# Patient Record
Sex: Male | Born: 1950
Health system: Southern US, Community
[De-identification: ages and names within clinical notes are randomized; demographics above are authoritative.]

## PROBLEM LIST (undated history)

## (undated) DIAGNOSIS — K219 Gastro-esophageal reflux disease without esophagitis: Secondary | ICD-10-CM

## (undated) DIAGNOSIS — J449 Chronic obstructive pulmonary disease, unspecified: Secondary | ICD-10-CM

## (undated) DIAGNOSIS — J45909 Unspecified asthma, uncomplicated: Secondary | ICD-10-CM

## (undated) HISTORY — DX: Gastro-esophageal reflux disease without esophagitis: K21.9

## (undated) HISTORY — DX: Unspecified asthma, uncomplicated: J45.909

---

## 2017-03-10 ENCOUNTER — Other Ambulatory Visit: Payer: Self-pay

## 2017-03-10 DIAGNOSIS — Y929 Unspecified place or not applicable: Secondary | ICD-10-CM | POA: Insufficient documentation

## 2017-03-10 DIAGNOSIS — S80862A Insect bite (nonvenomous), left lower leg, initial encounter: Secondary | ICD-10-CM | POA: Insufficient documentation

## 2017-03-10 DIAGNOSIS — L03116 Cellulitis of left lower limb: Secondary | ICD-10-CM | POA: Insufficient documentation

## 2017-03-10 DIAGNOSIS — W57XXXA Bitten or stung by nonvenomous insect and other nonvenomous arthropods, initial encounter: Secondary | ICD-10-CM | POA: Insufficient documentation

## 2017-03-10 DIAGNOSIS — J449 Chronic obstructive pulmonary disease, unspecified: Secondary | ICD-10-CM | POA: Insufficient documentation

## 2017-03-10 DIAGNOSIS — Y9389 Activity, other specified: Secondary | ICD-10-CM | POA: Insufficient documentation

## 2017-03-10 DIAGNOSIS — Y998 Other external cause status: Secondary | ICD-10-CM | POA: Insufficient documentation

## 2017-03-11 ENCOUNTER — Encounter (HOSPITAL_COMMUNITY): Payer: Self-pay | Admitting: Emergency Medicine

## 2017-03-11 ENCOUNTER — Other Ambulatory Visit: Payer: Self-pay

## 2017-03-11 ENCOUNTER — Emergency Department (HOSPITAL_COMMUNITY)
Admission: EM | Admit: 2017-03-11 | Discharge: 2017-03-11 | Disposition: A | Discharge status: Home / Self Care | Payer: Self-pay | Attending: Emergency Medicine | Admitting: Emergency Medicine

## 2017-03-11 DIAGNOSIS — L03116 Cellulitis of left lower limb: Secondary | ICD-10-CM

## 2017-03-11 DIAGNOSIS — W57XXXA Bitten or stung by nonvenomous insect and other nonvenomous arthropods, initial encounter: Secondary | ICD-10-CM

## 2017-03-11 HISTORY — DX: Chronic obstructive pulmonary disease, unspecified: J44.9

## 2017-03-11 MED ORDER — CLINDAMYCIN HCL 150 MG PO CAPS: 300.0000 mg | ORAL_CAPSULE | Freq: Three times a day (TID) | ORAL | 0 refills | Status: DC

## 2017-03-11 MED ORDER — CLINDAMYCIN HCL 150 MG PO CAPS
300.0000 mg | ORAL_CAPSULE | Freq: Once | ORAL | Status: AC
  Administered 2017-03-11: 300 mg via ORAL
  Filled 2017-03-11: qty 2

## 2017-03-11 NOTE — ED Notes (Signed)
ED Provider at bedside. 

## 2017-03-11 NOTE — ED Notes (Signed)
See EDP assessment 

## 2017-03-11 NOTE — ED Notes (Signed)
Pt verbalizes understanding of d/c instructions. Pt received prescriptions. Pt ambulatory at d/c with all belongings.  

## 2017-03-11 NOTE — Discharge Instructions (Signed)
Take the clindamycin as prescribed. Keep area clean with soap and warm water.  Try to keep covered if out and about to prevent further infection/irritation. Follow-up with your primary care doctor. Return here for any new/acute changes-- high fever, increased redness/swelling, etc.

## 2017-03-11 NOTE — ED Triage Notes (Signed)
Pt reports he was bitten by a spider two days ago, he states he "doctored it" but it continues to grow - is red, swollen and painful.

## 2017-03-11 NOTE — ED Provider Notes (Signed)
[facility] EMERGENCY [facility] Provider Note   CSN: [id number] Arrival date & time: 03/10/17  2354     History   Chief Complaint Chief Complaint  Patient presents with  . Insect Bite    HPI Carlos Hudson is a 66 y.o. male.  The history is provided by the patient and medical records.     66 year old male with history of COPD, presenting to the ED with spider bite to left calf.  States this happened 2 days ago.  He has been keeping it clean with alcohol swabs at home but continues to become more red and painful.  He denies any fevers.  No numbness or weakness of the affected leg.  He has no history of diabetes or impaired wound healing.  Past Medical History:  Diagnosis Date  . COPD (chronic obstructive pulmonary disease) (HCC)     There are no active problems to display for this patient.   History reviewed. No pertinent surgical history.     Home Medications    Prior to Admission medications   Medication Sig Start Date End Date Taking? Authorizing Provider  clindamycin (CLEOCIN) 150 MG capsule Take 2 capsules (300 mg total) by mouth 3 (three) times daily. May dispense as 150mg  capsules 03/11/17   Garlon HatchetSanders, Amorah Sebring M, PA-C    Family History No family history on file.  Social History Social History   Tobacco Use  . Smoking status: Not on file  Substance Use Topics  . Alcohol use: Not on file  . Drug use: Not on file     Allergies   Fish allergy   Review of Systems Review of Systems  Skin: Positive for wound.  All other systems reviewed and are negative.    Physical Exam Updated Vital Signs BP 140/66 (BP Location: Right Arm)   Pulse 80   Temp 98.2 F (36.8 C) (Oral)   Resp 18   SpO2 98%   Physical Exam  Constitutional: He is oriented to person, place, and time. He appears well-developed and well-nourished.  HENT:  Head: Normocephalic and atraumatic.  Mouth/Throat: Oropharynx is clear and moist.  Eyes: Conjunctivae and EOM  are normal. Pupils are equal, round, and reactive to light.  Neck: Normal range of motion.  Cardiovascular: Normal rate, regular rhythm and normal heart sounds.  Pulmonary/Chest: Effort normal and breath sounds normal.  Abdominal: Soft. Bowel sounds are normal.  Musculoskeletal: Normal range of motion.  Left medial calf with apparent bug bite, there is surrounding erythema and induration extending about 1.5 cm on all sides; there is no lymphangitis of the leg; area is locally warm to touch, no active drainage or bleeding; no fluctuance  Neurological: He is alert and oriented to person, place, and time.  Skin: Skin is warm and dry.  Psychiatric: He has a normal mood and affect.  Nursing note and vitals reviewed.    ED Treatments / Results  Labs (all labs ordered are listed, but only abnormal results are displayed) Labs Reviewed - No data to display  EKG  EKG Interpretation None       Radiology No results found.  Procedures Procedures (including critical care time)  Medications Ordered in ED Medications  clindamycin (CLEOCIN) capsule 300 mg (not administered)     Initial Impression / Assessment and Plan / ED Course  I have reviewed the triage vital signs and the nursing notes.  Pertinent labs & imaging results that were available during my care of the patient were reviewed by me  and considered in my medical decision making (see chart for details).  66 year old male here with spider bite to left medial calf.  There is evidence of bite on exam with surrounding cellulitic changes.  There is no lymphangitis of the leg.  There is no active drainage, fluctuance, or bleeding.  Patient is not diabetic, no history of delayed wound healing.  VSS, non-toxic in appearance.  Recommended oral antibiotics, first dose given here.  Discussed home wound care and monitoring for any worsening infection.  Close follow-up with PCP.  Discussed plan with patient, he/she acknowledged understanding  and agreed with plan of care.  Return precautions given for new or worsening symptoms.  Final Clinical Impressions(s) / ED Diagnoses   Final diagnoses:  Insect bite, initial encounter  Cellulitis of left lower extremity    ED Discharge Orders        Ordered    clindamycin (CLEOCIN) 150 MG capsule  3 times daily     03/11/17 0131       Garlon HatchetSanders, Krina Mraz M, PA-C 03/11/17 0139    Shon BatonHorton, Courtney F, MD 03/11/17 [phone]

## 2017-04-28 ENCOUNTER — Encounter (HOSPITAL_COMMUNITY): Payer: Self-pay | Admitting: Nurse Practitioner

## 2017-04-28 ENCOUNTER — Other Ambulatory Visit: Payer: Self-pay

## 2017-04-28 ENCOUNTER — Emergency Department (HOSPITAL_COMMUNITY)
Admission: EM | Admit: 2017-04-28 | Discharge: 2017-04-28 | Disposition: A | Discharge status: Home / Self Care | Payer: Self-pay | Attending: Emergency Medicine | Admitting: Emergency Medicine

## 2017-04-28 ENCOUNTER — Emergency Department (HOSPITAL_COMMUNITY): Payer: Self-pay

## 2017-04-28 ENCOUNTER — Encounter: Payer: Self-pay | Admitting: Pediatric Intensive Care

## 2017-04-28 DIAGNOSIS — J449 Chronic obstructive pulmonary disease, unspecified: Secondary | ICD-10-CM | POA: Insufficient documentation

## 2017-04-28 DIAGNOSIS — J111 Influenza due to unidentified influenza virus with other respiratory manifestations: Secondary | ICD-10-CM

## 2017-04-28 DIAGNOSIS — J101 Influenza due to other identified influenza virus with other respiratory manifestations: Secondary | ICD-10-CM | POA: Insufficient documentation

## 2017-04-28 DIAGNOSIS — F17200 Nicotine dependence, unspecified, uncomplicated: Secondary | ICD-10-CM | POA: Insufficient documentation

## 2017-04-28 LAB — INFLUENZA PANEL BY PCR (TYPE A & B)
INFLAPCR: POSITIVE — AB
INFLBPCR: NEGATIVE

## 2017-04-28 MED ORDER — ALBUTEROL SULFATE HFA 108 (90 BASE) MCG/ACT IN AERS: 1.0000 | INHALATION_SPRAY | Freq: Four times a day (QID) | RESPIRATORY_TRACT | 0 refills | Status: DC | PRN

## 2017-04-28 MED ORDER — IPRATROPIUM-ALBUTEROL 0.5-2.5 (3) MG/3ML IN SOLN
3.0000 mL | Freq: Once | RESPIRATORY_TRACT | Status: AC
  Administered 2017-04-28: 3 mL via RESPIRATORY_TRACT
  Filled 2017-04-28: qty 3

## 2017-04-28 MED ORDER — OSELTAMIVIR PHOSPHATE 75 MG PO CAPS: 75.0000 mg | ORAL_CAPSULE | Freq: Two times a day (BID) | ORAL | 0 refills | Status: DC

## 2017-04-28 MED ORDER — OSELTAMIVIR PHOSPHATE 75 MG PO CAPS
75.0000 mg | ORAL_CAPSULE | Freq: Once | ORAL | Status: AC
  Administered 2017-04-28: 75 mg via ORAL
  Filled 2017-04-28: qty 1

## 2017-04-28 NOTE — ED Provider Notes (Signed)
Patient reports cough and flulike symptoms for the past 4 or 5 days.  He states "my breathing is fine" patient is not acutely ill-appearing lungs clear to auscultation.  No respiratory distress Chest x-ray viewed by me   Carlos Hudson, Carlos Nobles, MD 04/28/17 2135

## 2017-04-28 NOTE — Congregational Nurse Program (Signed)
Congregational Nurse Program Note  Date of Encounter: 04/28/2017  Past Medical History: Past Medical History:  Diagnosis Date  . COPD (chronic obstructive pulmonary disease) (HCC)     Encounter Details: CNP Questionnaire - 04/28/17 0840      Questionnaire   Patient Status  Not Applicable    Race  White or Caucasian    Location Patient Served At  [employer]    Insurance  Not Applicable    Uninsured  Uninsured (NEW 1x/quarter)    Food  No food insecurities    Housing/Utilities  Worried about losing housing    Transportation  Yes, need transportation assistance    Interpersonal Safety  Yes, feel physically and emotionally safe where you currently live    Medication  No medication insecurities    Medical Provider  No    Referrals  Emergency Department    ED Visit Averted  Not Applicable    Life-Saving Intervention Made  Not Applicable      Lobby guest. Complains of nausea and vomiting, lack of appetite since Friday. States he has body aches, sweating. States he has been drinking fluids. States that he has burning on urination and his urine is "dark".  BBS- clear with good air movement. Client temp is 99.8 and he appears ill. Denies any chronic health issues, alcohol or drug use. CN advises client be evaluated in ED as he does not have a PCP or insurance. Client agrees. Cab vouchers given.

## 2017-04-28 NOTE — ED Provider Notes (Signed)
[facility]-EMERGENCY DEPT Provider Note   CSN: [id number] Arrival date & time: 04/28/17  [id number]     History   Chief Complaint Chief Complaint  Patient presents with  . flu like symptoms    HPI   Blood pressure 118/90, pulse 78, temperature 98.3 F (36.8 C), temperature source Oral, resp. rate 16, height 6' (1.829 m), weight 84.8 kg (187 lb), SpO2 94 %.  Carlos Hudson is a 67 y.o. male with past medical history significant for tobacco use, COPD sent from home a shelter for evaluation of low oxygen and flu.  Patient states that approximately 4 days ago he started feeling very lightheaded with tactile fever, chills, rhinorrhea, cough.  No chest pain, he has had some mild shortness of breath with no dyspnea on exertion, orthopnea, PND, increasing peripheral edema.  Past Medical History:  Diagnosis Date  . COPD (chronic obstructive pulmonary disease) (HCC)     There are no active problems to display for this patient.   History reviewed. No pertinent surgical history.     Home Medications    Prior to Admission medications   Medication Sig Start Date End Date Taking? Authorizing Provider  albuterol (PROVENTIL HFA;VENTOLIN HFA) 108 (90 Base) MCG/ACT inhaler Inhale 1-2 puffs into the lungs every 6 (six) hours as needed for wheezing or shortness of breath. 04/28/17   Dylin Ihnen, Joni ReiningNicole, PA-C  clindamycin (CLEOCIN) 150 MG capsule Take 2 capsules (300 mg total) by mouth 3 (three) times daily. May dispense as 150mg  capsules Patient not taking: Reported on 04/28/2017 03/11/17   Garlon HatchetSanders, Lisa M, PA-C  oseltamivir (TAMIFLU) 75 MG capsule Take 1 capsule (75 mg total) by mouth every 12 (twelve) hours. 04/28/17   Cloee Dunwoody, Mardella LaymanNicole, PA-C    Family History History reviewed. No pertinent family history.  Social History Social History   Tobacco Use  . Smoking status: Current Every Day Smoker  Substance Use Topics  . Alcohol use: No    Frequency: Never  . Drug use: No      Allergies   Clindamycin/lincomycin; Iodinated diagnostic agents; and Fish allergy   Review of Systems Review of Systems  A complete review of systems was obtained and all systems are negative except as noted in the HPI and PMH.   Physical Exam Updated Vital Signs BP 118/90 (BP Location: Left Arm)   Pulse 78   Temp 98.3 F (36.8 C) (Oral)   Resp 16   Ht 6' (1.829 m)   Wt 84.8 kg (187 lb)   SpO2 94%   BMI 25.36 kg/m   Physical Exam  Constitutional: He is oriented to person, place, and time. He appears well-developed and well-nourished. No distress.  HENT:  Head: Normocephalic and atraumatic.  Mouth/Throat: Oropharynx is clear and moist.  Eyes: Conjunctivae and EOM are normal. Pupils are equal, round, and reactive to light.  Neck: Normal range of motion.  Cardiovascular: Normal rate, regular rhythm and intact distal pulses.  Pulmonary/Chest: Effort normal.  Reduced air movement at the bases, expiratory wheezing, mild and scattered.  Abdominal: Soft. There is no tenderness.  Musculoskeletal: Normal range of motion.  Neurological: He is alert and oriented to person, place, and time.  Skin: He is not diaphoretic.  Psychiatric: He has a normal mood and affect.  Nursing note and vitals reviewed.    ED Treatments / Results  Labs (all labs ordered are listed, but only abnormal results are displayed) Labs Reviewed  INFLUENZA PANEL BY PCR (TYPE A & B) - Abnormal;  Notable for the following components:      Result Value   Influenza A By PCR POSITIVE (*)    All other components within normal limits    EKG  EKG Interpretation None       Radiology Dg Chest 2 View  Result Date: 04/28/2017 CLINICAL DATA:  Cough and congestion for 4 or 5 days. EXAM: CHEST  2 VIEW COMPARISON:  None. FINDINGS: The heart, hila, and mediastinum are normal. No pulmonary nodules or masses. No focal infiltrates. Mild prominence of the interstitial markings. IMPRESSION: No focal infiltrate.  Mild prominence of the interstitial markings may be due to the patient's smoking status or an atypical infection. Electronically Signed   By: Gerome Sam III M.D   On: 04/28/2017 19:50    Procedures Procedures (including critical care time)  Medications Ordered in ED Medications  oseltamivir (TAMIFLU) capsule 75 mg (not administered)  ipratropium-albuterol (DUONEB) 0.5-2.5 (3) MG/3ML nebulizer solution 3 mL (3 mLs Nebulization Given 04/28/17 2011)     Initial Impression / Assessment and Plan / ED Course  I have reviewed the triage vital signs and the nursing notes.  Pertinent labs & imaging results that were available during my care of the patient were reviewed by me and considered in my medical decision making (see chart for details).    Vitals:   04/28/17 1515 04/28/17 1731 04/28/17 2010 04/28/17 2011  BP: 113/71 124/78 118/90   Pulse: 73 87 78   Resp: 18 14 16    Temp:  98.3 F (36.8 C)    TempSrc:  Oral    SpO2: 93% 95% 100% 94%  Weight:      Height:        Medications  oseltamivir (TAMIFLU) capsule 75 mg (not administered)  ipratropium-albuterol (DUONEB) 0.5-2.5 (3) MG/3ML nebulizer solution 3 mL (3 mLs Nebulization Given 04/28/17 2011)    Carlos Hudson is 67 y.o. male presenting with hypoxia, influenza-like illness and cough over the last 4 days.  Initially patient was satting around 90% on room air.  Lung sounds with mild wheezing.  No signs or symptoms to suggest CHF exacerbation.  More likely COPD.  Patient given DuoNeb, chest x-ray without infiltrate, flu pending.  Flu a positive, patient comfortable, remains afebrile, saturating well on room air.  Given his underlying COPD and smoking will start him on Tamiflu.  He states that there is a program that his housing development that will help him pay for his medicine.  Evaluation does not show pathology that would require ongoing emergent intervention or inpatient treatment. Pt is hemodynamically stable and mentating  appropriately. Discussed findings and plan with patient/guardian, who agrees with care plan. All questions answered. Return precautions discussed and outpatient follow up given.     Final Clinical Impressions(s) / ED Diagnoses   Final diagnoses:  Influenza    ED Discharge Orders        Ordered    oseltamivir (TAMIFLU) 75 MG capsule  Every 12 hours     04/28/17 2138    albuterol (PROVENTIL HFA;VENTOLIN HFA) 108 (90 Base) MCG/ACT inhaler  Every 6 hours PRN     04/28/17 2139       Mateusz Neilan, Mardella Layman 04/28/17 2140    Doug Sou, MD 04/28/17 [phone]

## 2017-04-28 NOTE — ED Triage Notes (Signed)
Patient was at [facility] and the nurse took his blood pressure and stated it was low to come to the ER via taxi.

## 2017-04-28 NOTE — Discharge Instructions (Signed)
Please follow with your primary care doctor in the next 2 days for a check-up. They must obtain records for further management.  ° °Do not hesitate to return to the Emergency Department for any new, worsening or concerning symptoms.  ° °

## 2017-04-28 NOTE — ED Notes (Signed)
Talked with [facility] they will accept him back to the lobby for the night

## 2017-04-28 NOTE — ED Triage Notes (Signed)
Patient here from [facility] due to loosing job and being out of a home for a few days. Patient states he doesn't not drink ETOH or do any illegal drugs just needed help with housing. Patient oxygen was low not his blood pressure. Patient states he has been having fever, chills body aches for 4 days now. Patient has been nauseous and vomits if he trys to eat. Patient never received a flu shot this year. Nurse wanted patient checked for the flu.

## 2017-05-01 ENCOUNTER — Encounter: Payer: Self-pay | Admitting: Pediatric Intensive Care

## 2017-05-01 MED FILL — VENTOLIN HFA 90 MCG INHALER: 108 (90 BAS | 25 days supply | Qty: 18 | Fill #0

## 2017-05-01 MED FILL — OSELTAMIVIR PHOSPHATE 75 MG: 75 | 5 days supply | Qty: 10 | Fill #0

## 2017-05-12 ENCOUNTER — Encounter: Payer: Self-pay | Admitting: Pediatric Intensive Care

## 2017-05-18 ENCOUNTER — Encounter: Payer: Self-pay | Admitting: Pediatric Intensive Care

## 2017-05-21 NOTE — Congregational Nurse Program (Signed)
Congregational Nurse Program Note  Date of Encounter: 05/01/2017  Past Medical History: Past Medical History:  Diagnosis Date  . COPD (chronic obstructive pulmonary disease) (HCC)     Encounter Details: CNP Questionnaire - 05/01/17 1000      Questionnaire   Patient Status  Not Applicable    Race  White or Caucasian    Location Patient Served At  [employer]    Insurance  Not Applicable    Uninsured  Uninsured (Subsequent visits/quarter)    Food  No food insecurities    Housing/Utilities  No permanent housing    Transportation  No transportation needs    Interpersonal Safety  Yes, feel physically and emotionally safe where you currently live    Medication  Yes, have medication insecurities;Provided medication assistance    Medical Provider  No    Referrals  Not Applicable    ED Visit Averted  Not Applicable    Life-Saving Intervention Made  Not Applicable      Client was diagnosed with flu in ED and has prescriptions for Tamiflu and albuterol inhaler. CN will pick up prescriptions. BBS- course with inspiratory wheezing. CN advised client to follow up in clinic next week.

## 2017-05-30 NOTE — Congregational Nurse Program (Signed)
Congregational Nurse Program Note  Date of Encounter: 05/18/2017  Past Medical History: Past Medical History:  Diagnosis Date  . COPD (chronic obstructive pulmonary disease) (HCC)     Encounter Details: CNP Questionnaire - 05/18/17 1045      Questionnaire   Patient Status  Not Applicable    Race  White or Caucasian    Location [facility] Served At  [employer]    Insurance  Not Applicable    Uninsured  Uninsured (Subsequent visits/quarter)    Food  No food insecurities    Housing/Utilities  No permanent housing    Transportation  Yes, need transportation assistance;Provided transportation assistance (bus pass, taxi voucher, etc.)    Interpersonal Safety  Yes, feel physically and emotionally safe where you currently live    Medication  No medication insecurities    Medical Provider  No    Referrals  Primary Care Provider/Clinic    ED Visit Averted  Not Applicable    Life-Saving Intervention Made  Not Applicable      [facility].

## 2017-05-30 NOTE — Congregational Nurse Program (Signed)
Congregational Nurse Program Note  Date of Encounter: 05/12/2017  Past Medical History: Past Medical History:  Diagnosis Date  . COPD (chronic obstructive pulmonary disease) (HCC)     Encounter Details: CNP Questionnaire - 05/12/17 0930      Questionnaire   Patient Status  Not Applicable    Race  White or Caucasian    Location Patient Served At  [employer]    Insurance  Not Applicable    Uninsured  Uninsured (Subsequent visits/quarter)    Food  No food insecurities    Housing/Utilities  No permanent housing    Transportation  No transportation needs    Interpersonal Safety  Yes, feel physically and emotionally safe where you currently live    Medication  No medication insecurities    Medical Provider  No    Referrals  Primary Care Provider/Clinic    ED Visit Averted  Not Applicable    Life-Saving Intervention Made  Not Applicable      Client states low energy and nausea since having flu a few weeks ago. Marland Kitchen. He states he has not had diarrhea or vomiting. He spoke with the [employer] intern regarding signing up for Medicare. Follow up in CN clinic if symptoms do not resolve.

## 2017-06-10 ENCOUNTER — Inpatient Hospital Stay: Payer: Self-pay | Admitting: Critical Care Medicine

## 2017-06-21 ENCOUNTER — Emergency Department (HOSPITAL_COMMUNITY): Payer: Self-pay

## 2017-06-21 ENCOUNTER — Encounter (HOSPITAL_COMMUNITY): Payer: Self-pay

## 2017-06-21 ENCOUNTER — Emergency Department (HOSPITAL_COMMUNITY)
Admission: EM | Admit: 2017-06-21 | Discharge: 2017-06-21 | Disposition: A | Discharge status: Home / Self Care | Payer: Self-pay | Attending: Emergency Medicine | Admitting: Emergency Medicine

## 2017-06-21 DIAGNOSIS — J440 Chronic obstructive pulmonary disease with acute lower respiratory infection: Secondary | ICD-10-CM | POA: Insufficient documentation

## 2017-06-21 DIAGNOSIS — J209 Acute bronchitis, unspecified: Secondary | ICD-10-CM | POA: Insufficient documentation

## 2017-06-21 DIAGNOSIS — F17200 Nicotine dependence, unspecified, uncomplicated: Secondary | ICD-10-CM | POA: Insufficient documentation

## 2017-06-21 DIAGNOSIS — Z7982 Long term (current) use of aspirin: Secondary | ICD-10-CM | POA: Insufficient documentation

## 2017-06-21 LAB — CBC WITH DIFFERENTIAL/PLATELET
Basophils Absolute: 0 10*3/uL (ref 0.0–0.1)
Basophils Relative: 0 %
EOS ABS: 0.1 10*3/uL (ref 0.0–0.7)
Eosinophils Relative: 1 %
HEMATOCRIT: 47 % (ref 39.0–52.0)
HEMOGLOBIN: 15.3 g/dL (ref 13.0–17.0)
LYMPHS ABS: 1.9 10*3/uL (ref 0.7–4.0)
Lymphocytes Relative: 17 %
MCH: 29.4 pg (ref 26.0–34.0)
MCHC: 32.6 g/dL (ref 30.0–36.0)
MCV: 90.4 fL (ref 78.0–100.0)
Monocytes Absolute: 1 10*3/uL (ref 0.1–1.0)
Monocytes Relative: 9 %
NEUTROS ABS: 7.9 10*3/uL — AB (ref 1.7–7.7)
NEUTROS PCT: 73 %
Platelets: 285 10*3/uL (ref 150–400)
RBC: 5.2 MIL/uL (ref 4.22–5.81)
RDW: 14.1 % (ref 11.5–15.5)
WBC: 10.8 10*3/uL — AB (ref 4.0–10.5)

## 2017-06-21 LAB — COMPREHENSIVE METABOLIC PANEL
ALT: 16 U/L — AB (ref 17–63)
ANION GAP: 8 (ref 5–15)
AST: 18 U/L (ref 15–41)
Albumin: 4 g/dL (ref 3.5–5.0)
Alkaline Phosphatase: 61 U/L (ref 38–126)
BUN: 11 mg/dL (ref 6–20)
CHLORIDE: 103 mmol/L (ref 101–111)
CO2: 22 mmol/L (ref 22–32)
CREATININE: 1.02 mg/dL (ref 0.61–1.24)
Calcium: 8.8 mg/dL — ABNORMAL LOW (ref 8.9–10.3)
Glucose, Bld: 106 mg/dL — ABNORMAL HIGH (ref 65–99)
POTASSIUM: 4.2 mmol/L (ref 3.5–5.1)
Sodium: 133 mmol/L — ABNORMAL LOW (ref 135–145)
Total Bilirubin: 0.9 mg/dL (ref 0.3–1.2)
Total Protein: 7.5 g/dL (ref 6.5–8.1)

## 2017-06-21 MED ORDER — ALBUTEROL SULFATE HFA 108 (90 BASE) MCG/ACT IN AERS
1.0000 | INHALATION_SPRAY | Freq: Four times a day (QID) | RESPIRATORY_TRACT | Status: DC
  Administered 2017-06-21: 1 via RESPIRATORY_TRACT
  Filled 2017-06-21: qty 6.7

## 2017-06-21 MED ORDER — ALBUTEROL SULFATE (2.5 MG/3ML) 0.083% IN NEBU
5.0000 mg | INHALATION_SOLUTION | Freq: Once | RESPIRATORY_TRACT | Status: AC
  Administered 2017-06-21: 5 mg via RESPIRATORY_TRACT
  Filled 2017-06-21: qty 6

## 2017-06-21 MED ORDER — AEROCHAMBER PLUS FLO-VU MEDIUM MISC: 1.0000 | Freq: Once | Status: DC

## 2017-06-21 NOTE — Discharge Instructions (Signed)
Use your albuterol inhaler with spacer 2 puffs every 4 hours as needed for cough or shortness of breath.  Return if needed more than every 4 hours.  [facility] at [facility] to help you to stop smoking

## 2017-06-21 NOTE — ED Triage Notes (Signed)
Patient complains of 1 week of cough and congestion. States that the cough is making him feel SOB. Patient alert and oriented, smoker

## 2017-06-21 NOTE — ED Notes (Signed)
PT ambulated while on pulse oximetry Pt O2 sats remained in the 93-95 range and HR in the 80s.

## 2017-06-21 NOTE — ED Provider Notes (Signed)
[facility] EMERGENCY DEPARTMENT Provider Note   CSN: [id number] Arrival date & time: 06/21/17  0850     History   Chief Complaint Chief Complaint  Patient presents with  . cough/SOB    HPI Carlos Hudson is a 67 y.o. male.  HPI Complains of cough productive of white sputum for 1 week.  He denies any fever denies vomiting nothing makes symptoms better or worse.  Associated symptoms include bilateral chest pain with coughing only.  No vomiting.  No other associated symptoms.  No treatment prior to coming here.  He reports that he ran out of his albuterol inhaler 3 days ago. Past Medical History:  Diagnosis Date  . COPD (chronic obstructive pulmonary disease) (HCC)     There are no active problems to display for this patient.   History reviewed. No pertinent surgical history.      Home Medications    Prior to Admission medications   Medication Sig Start Date End Date Taking? Authorizing Provider  albuterol (PROVENTIL HFA;VENTOLIN HFA) 108 (90 Base) MCG/ACT inhaler Inhale 1-2 puffs into the lungs every 6 (six) hours as needed for wheezing or shortness of breath. 04/28/17  Yes Pisciotta, Mardella Layman  aspirin 325 MG EC tablet Take 325 mg by mouth daily.   Yes [provider]  clindamycin (CLEOCIN) 150 MG capsule Take 2 capsules (300 mg total) by mouth 3 (three) times daily. May dispense as 150mg  capsules Patient not taking: Reported on 04/28/2017 03/11/17   Garlon Hatchet, PA-C  oseltamivir (TAMIFLU) 75 MG capsule Take 1 capsule (75 mg total) by mouth every 12 (twelve) hours. Patient not taking: Reported on 06/21/2017 04/28/17   Pisciotta, Joni Reining, PA-C    Family History No family history on file.  Social History Social History   Tobacco Use  . Smoking status: Current Every Day Smoker  . Smokeless tobacco: Never Used  Substance Use Topics  . Alcohol use: No    Frequency: Never  . Drug use: No     Allergies   Clindamycin/lincomycin;  Iodinated diagnostic agents; and Fish allergy   Review of Systems Review of Systems  Constitutional: Negative.   HENT: Negative.   Respiratory: Positive for cough and shortness of breath.   Cardiovascular: Negative.   Gastrointestinal: Negative.   Musculoskeletal: Negative.   Skin: Negative.   Neurological: Negative.   Psychiatric/Behavioral: Negative.   All other systems reviewed and are negative.    Physical Exam Updated Vital Signs BP 120/67 (BP Location: Right Arm)   Pulse 86   Temp 98.1 F (36.7 C) (Oral)   Resp 20   SpO2 99%   Physical Exam  Constitutional: He appears well-developed and well-nourished. No distress.  HENT:  Head: Normocephalic and atraumatic.  Eyes: Pupils are equal, round, and reactive to light. Conjunctivae are normal.  Neck: Neck supple. No tracheal deviation present. No thyromegaly present.  Cardiovascular: Normal rate and regular rhythm.  No murmur heard. Pulmonary/Chest: Effort normal. He has wheezes.  No respiratory distress, speaks in paragraphs.  End expiratory wheezes  Abdominal: Soft. Bowel sounds are normal. He exhibits no distension. There is no tenderness.  Musculoskeletal: Normal range of motion. He exhibits no edema or tenderness.  Neurological: He is alert. Coordination normal.  Skin: Skin is warm and dry. No rash noted.  Psychiatric: He has a normal mood and affect.  Nursing note and vitals reviewed.    ED Treatments / Results  Labs (all labs ordered are listed, but only abnormal results are  displayed) Labs Reviewed  COMPREHENSIVE METABOLIC PANEL - Abnormal; Notable for the following components:      Result Value   Sodium 133 (*)    Glucose, Bld 106 (*)    Calcium 8.8 (*)    ALT 16 (*)    All other components within normal limits  CBC WITH DIFFERENTIAL/PLATELET - Abnormal; Notable for the following components:   WBC 10.8 (*)    Neutro Abs 7.9 (*)    All other components within normal limits   Chest x-ray viewed by  me EKG EKG Interpretation  Date/Time:  Sunday June 21 2017 08:53:51 EDT Ventricular Rate:  84 PR Interval:  150 QRS Duration: 134 QT Interval:  390 QTC Calculation: 460 R Axis:   106 Text Interpretation:  Normal sinus rhythm Right bundle branch block Abnormal ECG No old tracing to compare Confirmed by Doug SouJacubowitz, Raziyah Vanvleck [phone]([id number]) on 06/21/2017 12:45:07 PM  Results for orders placed or performed during the hospital encounter of 06/21/17  Comprehensive metabolic panel  Result Value Ref Range   Sodium 133 (L) 135 - 145 mmol/L   Potassium 4.2 3.5 - 5.1 mmol/L   Chloride 103 101 - 111 mmol/L   CO2 22 22 - 32 mmol/L   Glucose, Bld 106 (H) 65 - 99 mg/dL   BUN 11 6 - 20 mg/dL   Creatinine, Ser 6.041.02 0.61 - 1.24 mg/dL   Calcium 8.8 (L) 8.9 - 10.3 mg/dL   Total Protein 7.5 6.5 - 8.1 g/dL   Albumin 4.0 3.5 - 5.0 g/dL   AST 18 15 - 41 U/L   ALT 16 (L) 17 - 63 U/L   Alkaline Phosphatase 61 38 - 126 U/L   Total Bilirubin 0.9 0.3 - 1.2 mg/dL   GFR calc non Af Amer >60 >60 mL/min   GFR calc Af Amer >60 >60 mL/min   Anion gap 8 5 - 15  CBC with Differential  Result Value Ref Range   WBC 10.8 (H) 4.0 - 10.5 K/uL   RBC 5.20 4.22 - 5.81 MIL/uL   Hemoglobin 15.3 13.0 - 17.0 g/dL   HCT 54.047.0 98.139.0 - 19.152.0 %   MCV 90.4 78.0 - 100.0 fL   MCH 29.4 26.0 - 34.0 pg   MCHC 32.6 30.0 - 36.0 g/dL   RDW 47.814.1 29.511.5 - 62.115.5 %   Platelets 285 150 - 400 K/uL   Neutrophils Relative % 73 %   Neutro Abs 7.9 (H) 1.7 - 7.7 K/uL   Lymphocytes Relative 17 %   Lymphs Abs 1.9 0.7 - 4.0 K/uL   Monocytes Relative 9 %   Monocytes Absolute 1.0 0.1 - 1.0 K/uL   Eosinophils Relative 1 %   Eosinophils Absolute 0.1 0.0 - 0.7 K/uL   Basophils Relative 0 %   Basophils Absolute 0.0 0.0 - 0.1 K/uL   Dg Chest 2 View  Result Date: 06/21/2017 CLINICAL DATA:  Cough for several weeks.  Former smoker. EXAM: CHEST - 2 VIEW COMPARISON:  04/28/2017 FINDINGS: Normal heart size. Aortic atherosclerosis noted. No pleural effusion or edema.  Chronic interstitial coarsening is noted throughout both lungs with diffuse bronchial wall thickening. No superimposed airspace consolidation. IMPRESSION: 1. No acute cardiopulmonary abnormality. 2. Chronic interstitial coarsening. Electronically Signed   By: Signa Kellaylor  Stroud M.D.   On: 06/21/2017 09:45   Radiology Dg Chest 2 View  Result Date: 06/21/2017 CLINICAL DATA:  Cough for several weeks.  Former smoker. EXAM: CHEST - 2 VIEW COMPARISON:  04/28/2017 FINDINGS: Normal heart size. Aortic  atherosclerosis noted. No pleural effusion or edema. Chronic interstitial coarsening is noted throughout both lungs with diffuse bronchial wall thickening. No superimposed airspace consolidation. IMPRESSION: 1. No acute cardiopulmonary abnormality. 2. Chronic interstitial coarsening. Electronically Signed   By: Signa Kell M.D.   On: 06/21/2017 09:45    Procedures Procedures (including critical care time)  Medications Ordered in ED Medications  albuterol (PROVENTIL) (2.5 MG/3ML) 0.083% nebulizer solution 5 mg (has no administration in time range)     Initial Impression / Assessment and Plan / ED Course  I have reviewed the triage vital signs and the nursing notes.  Pertinent labs & imaging results that were available during my care of the patient were reviewed by me and considered in my medical decision making (see chart for details).     2:10 PM after 1 nebulized treatment with albuterol she is breathing at baseline.  He is able to ambulate in the emergency department without dyspnea.  Pulse oximetry 94% on room air.  His breathing is at baseline. Plan he will get an albuterol inhaler with spacer to go to use 2 puffs every 4 hours as needed for cough or shortness of breath instructed to return if needed more than hours.  I counseled patient for 5 minutes on smoking cessation  Final Clinical Impressions(s) / ED Diagnoses  Diagnosis #1 COPD exacerbation #2 acute bronchitis #3 tobacco abuse Final  diagnoses:  None    ED Discharge Orders    None       Doug Sou, MD 06/21/17 1422

## 2017-06-22 ENCOUNTER — Encounter: Payer: Self-pay | Admitting: Pediatric Intensive Care

## 2017-06-22 ENCOUNTER — Other Ambulatory Visit: Payer: Self-pay | Admitting: Critical Care Medicine

## 2017-06-22 MED ORDER — PREDNISONE 10 MG PO TABS: ORAL_TABLET | ORAL | 0 refills | Status: DC

## 2017-06-22 MED ORDER — FAMOTIDINE 20 MG PO TABS: 20.0000 mg | ORAL_TABLET | Freq: Every day | ORAL | 6 refills | Status: DC

## 2017-06-22 MED ORDER — AZITHROMYCIN 250 MG PO TABS: ORAL_TABLET | ORAL | 0 refills | Status: DC

## 2017-06-22 MED FILL — AZITHROMYCIN 250 MG TABLET: 250 | 5 days supply | Qty: 6 | Fill #0

## 2017-06-22 MED FILL — predniSONE 10 MG TABS: 10 | 8 days supply | Qty: 20 | Fill #0

## 2017-06-22 MED FILL — FAMOTIDINE 20 MG TABLET: 20 | 30 days supply | Qty: 30 | Fill #0

## 2017-06-22 NOTE — Progress Notes (Signed)
[facility] Pt with COPD and recent exacerbation.  Pt has albuterol inhaler Pt taking ASA daily  Pt with epigastric pain, cough and dyspnea. Hx of gastric ulcer  Down to 2 cig per day Pt recently seen in ED Rx albuterol neb and HFA prn alone  Px: Chest: insp/exp wheezes Cor RRR nl s1 s2 Abd Epigastric tenderness  Impr Copd exac Plan Cont saba two puff qid and prn   Instructed to proper use Azithromycin 250mg  Take two once then one daily until gone #6 Pred pulse 10mg  :  Take 4 for two days three for two days two for two days one for two days pepcid 20mg  qhs Stop ASA See me 07/15/17    Luisa HartPatrick WrightMD

## 2017-06-22 NOTE — Congregational Nurse Program (Signed)
Congregational Nurse Program Note  Date of Encounter: 06/22/2017  Past Medical History: Past Medical History:  Diagnosis Date  . COPD (chronic obstructive pulmonary disease) [facility](HCC)     Encounter Details: CNP Questionnaire - 06/22/17 0915      Questionnaire   Patient Status  Not Applicable    Race  White or Caucasian    [facility]    Insurance  Not Applicable    Uninsured  Uninsured (NEW 1x/quarter)    Food  No food insecurities    Housing/Utilities  No permanent housing    Transportation  Yes, need transportation assistance    Interpersonal Safety  Yes, feel physically and emotionally safe where you currently live    Medication  Yes, have medication insecurities;Provided medication assistance    Medical Provider  No    Referrals  Primary Care Provider/Clinic;Other    ED Visit Averted  Not Applicable    Life-Saving Intervention Made  Not Applicable      Client states that he went to ED due to breathing problems and increased coughing. Client assessed by Dr Jonni SangerP Wright in clinic. Noted significant wheezing. Dr Delford FieldWright will prescribe medication to Outpatient Pharmacy and CN will pick up. Client states that his partner cancelled his pulmonology appointment for this week but that [facility] CN advised client to keep in communication regarding appointments. Dr Delford FieldWright will follow up with client on 4/24.

## 2017-06-23 ENCOUNTER — Encounter: Payer: Self-pay | Admitting: Pediatric Intensive Care

## 2017-06-24 ENCOUNTER — Ambulatory Visit: Payer: Self-pay | Admitting: Critical Care Medicine

## 2017-07-03 ENCOUNTER — Emergency Department (HOSPITAL_COMMUNITY)
Admission: EM | Admit: 2017-07-03 | Discharge: 2017-07-03 | Disposition: A | Discharge status: Home / Self Care | Payer: Self-pay | Attending: Emergency Medicine | Admitting: Emergency Medicine

## 2017-07-03 ENCOUNTER — Encounter (HOSPITAL_COMMUNITY): Payer: Self-pay

## 2017-07-03 ENCOUNTER — Inpatient Hospital Stay: Payer: Self-pay

## 2017-07-03 ENCOUNTER — Emergency Department (HOSPITAL_COMMUNITY): Payer: Self-pay

## 2017-07-03 ENCOUNTER — Other Ambulatory Visit: Payer: Self-pay

## 2017-07-03 DIAGNOSIS — R0602 Shortness of breath: Secondary | ICD-10-CM | POA: Insufficient documentation

## 2017-07-03 DIAGNOSIS — R05 Cough: Secondary | ICD-10-CM | POA: Insufficient documentation

## 2017-07-03 DIAGNOSIS — J181 Lobar pneumonia, unspecified organism: Secondary | ICD-10-CM | POA: Insufficient documentation

## 2017-07-03 DIAGNOSIS — J449 Chronic obstructive pulmonary disease, unspecified: Secondary | ICD-10-CM | POA: Insufficient documentation

## 2017-07-03 DIAGNOSIS — J189 Pneumonia, unspecified organism: Secondary | ICD-10-CM

## 2017-07-03 DIAGNOSIS — Z79899 Other long term (current) drug therapy: Secondary | ICD-10-CM | POA: Insufficient documentation

## 2017-07-03 DIAGNOSIS — F17210 Nicotine dependence, cigarettes, uncomplicated: Secondary | ICD-10-CM | POA: Insufficient documentation

## 2017-07-03 LAB — CBC
HEMATOCRIT: 43.8 % (ref 39.0–52.0)
HEMOGLOBIN: 14.8 g/dL (ref 13.0–17.0)
MCH: 30.3 pg (ref 26.0–34.0)
MCHC: 33.8 g/dL (ref 30.0–36.0)
MCV: 89.8 fL (ref 78.0–100.0)
Platelets: 317 10*3/uL (ref 150–400)
RBC: 4.88 MIL/uL (ref 4.22–5.81)
RDW: 13.9 % (ref 11.5–15.5)
WBC: 22.9 10*3/uL — AB (ref 4.0–10.5)

## 2017-07-03 LAB — COMPREHENSIVE METABOLIC PANEL
ALBUMIN: 3.6 g/dL (ref 3.5–5.0)
ALT: 14 U/L — ABNORMAL LOW (ref 17–63)
ANION GAP: 10 (ref 5–15)
AST: 16 U/L (ref 15–41)
Alkaline Phosphatase: 76 U/L (ref 38–126)
BUN: 14 mg/dL (ref 6–20)
CHLORIDE: 103 mmol/L (ref 101–111)
CO2: 21 mmol/L — ABNORMAL LOW (ref 22–32)
Calcium: 8.7 mg/dL — ABNORMAL LOW (ref 8.9–10.3)
Creatinine, Ser: 1.02 mg/dL (ref 0.61–1.24)
GFR calc Af Amer: >60 mL/min (ref >60)
GFR calc non Af Amer: >60 mL/min (ref >60)
GLUCOSE: 135 mg/dL — AB (ref 65–99)
POTASSIUM: 4.4 mmol/L (ref 3.5–5.1)
Sodium: 134 mmol/L — ABNORMAL LOW (ref 135–145)
Total Bilirubin: 0.8 mg/dL (ref 0.3–1.2)
Total Protein: 7.5 g/dL (ref 6.5–8.1)

## 2017-07-03 LAB — URINALYSIS, ROUTINE W REFLEX MICROSCOPIC
Bilirubin Urine: NEGATIVE
Glucose, UA: NEGATIVE mg/dL
Hgb urine dipstick: NEGATIVE
KETONES UR: NEGATIVE mg/dL
LEUKOCYTES UA: NEGATIVE
Nitrite: NEGATIVE
PROTEIN: NEGATIVE mg/dL
Specific Gravity, Urine: 1.006 (ref 1.005–1.030)
pH: 7 (ref 5.0–8.0)

## 2017-07-03 LAB — LIPASE, BLOOD: LIPASE: 32 U/L (ref 11–51)

## 2017-07-03 LAB — I-STAT TROPONIN, ED: Troponin i, poc: 0.01 ng/mL (ref 0.00–0.08)

## 2017-07-03 MED ORDER — SODIUM CHLORIDE 0.9 % IV BOLUS
1000.0000 mL | Freq: Once | INTRAVENOUS | Status: AC
  Administered 2017-07-03: 1000 mL via INTRAVENOUS

## 2017-07-03 MED ORDER — SODIUM CHLORIDE 0.9 % IV SOLN
500.0000 mg | Freq: Once | INTRAVENOUS | Status: AC
  Administered 2017-07-03: 500 mg via INTRAVENOUS
  Filled 2017-07-03: qty 500

## 2017-07-03 MED ORDER — AZITHROMYCIN 250 MG PO TABS: 250.0000 mg | ORAL_TABLET | Freq: Every day | ORAL | 0 refills | Status: DC

## 2017-07-03 MED ORDER — ALBUTEROL SULFATE (2.5 MG/3ML) 0.083% IN NEBU
5.0000 mg | INHALATION_SOLUTION | Freq: Once | RESPIRATORY_TRACT | Status: AC
  Administered 2017-07-03: 5 mg via RESPIRATORY_TRACT
  Filled 2017-07-03: qty 6

## 2017-07-03 MED ORDER — SODIUM CHLORIDE 0.9 % IV BOLUS
500.0000 mL | Freq: Once | INTRAVENOUS | Status: AC
  Administered 2017-07-03: 500 mL via INTRAVENOUS

## 2017-07-03 MED ORDER — AMOXICILLIN 500 MG PO CAPS: 500.0000 mg | ORAL_CAPSULE | Freq: Three times a day (TID) | ORAL | 0 refills | Status: DC

## 2017-07-03 MED ORDER — SODIUM CHLORIDE 0.9 % IV SOLN
1.0000 g | Freq: Once | INTRAVENOUS | Status: AC
  Administered 2017-07-03: 1 g via INTRAVENOUS
  Filled 2017-07-03: qty 10

## 2017-07-03 MED ORDER — ALBUTEROL SULFATE HFA 108 (90 BASE) MCG/ACT IN AERS: 2.0000 | INHALATION_SPRAY | RESPIRATORY_TRACT | 1 refills | Status: DC | PRN

## 2017-07-03 NOTE — ED Triage Notes (Signed)
Pt endorses abd pain, productive cough with white sputum, shob and chest tightness since yesterday. VSS.

## 2017-07-03 NOTE — ED Provider Notes (Signed)
[facility] EMERGENCY DEPARTMENT Provider Note   CSN: [id number] Arrival date & time: 07/03/17  1202     History   Chief Complaint Chief Complaint  Patient presents with  . Abdominal Pain    HPI Carlos Hudson is a 67 y.o. male.  Patient c/o lower abd pain the past few days. Pain dull, moderate, occasionally burning, non radiating. Pt unaware of specific exacerbating or alleviating factors. Is having normal bms. No vomiting. Low grade fever in ED, pt was unaware of fevers. No dysuria or gu c/o. No scrotal or testicular pain or swelling.  Occasional non prod cough. +smoker. +wheezing intermittently, uses mdi prn. Denies prior abd surgery.   The history is provided by the patient.  Abdominal Pain   Pertinent negatives include fever, diarrhea, vomiting, dysuria and headaches.  Shortness of Breath  Associated symptoms include cough, wheezing and abdominal pain. Pertinent negatives include no fever, no headaches, no sore throat, no neck pain, no chest pain, no vomiting, no rash and no leg swelling.    Past Medical History:  Diagnosis Date  . COPD (chronic obstructive pulmonary disease) (HCC)     There are no active problems to display for this patient.   History reviewed. No pertinent surgical history.      Home Medications    Prior to Admission medications   Medication Sig Start Date End Date Taking? Authorizing Provider  albuterol (PROVENTIL HFA;VENTOLIN HFA) 108 (90 Base) MCG/ACT inhaler Inhale 1-2 puffs into the lungs every 6 (six) hours as needed for wheezing or shortness of breath. 04/28/17   Pisciotta, Joni Reining, PA-C  azithromycin (ZITHROMAX) 250 MG tablet Take two once then one daily until gone 06/22/17   Storm Frisk, MD  famotidine (PEPCID) 20 MG tablet Take 1 tablet (20 mg total) by mouth at bedtime. 06/22/17   Storm Frisk, MD  predniSONE (DELTASONE) 10 MG tablet Take 4 for two days three for two days two for two days one for two days 06/22/17    Storm Frisk, MD    Family History History reviewed. No pertinent family history.  Social History Social History   Tobacco Use  . Smoking status: Current Every Day Smoker    Packs/day: 0.50    Types: Cigarettes  . Smokeless tobacco: Never Used  Substance Use Topics  . Alcohol use: No    Frequency: Never  . Drug use: No     Allergies   Clindamycin/lincomycin; Iodinated diagnostic agents; and Fish allergy   Review of Systems Review of Systems  Constitutional: Negative for fever.  HENT: Negative for sore throat.   Eyes: Negative for redness.  Respiratory: Positive for cough and wheezing.   Cardiovascular: Negative for chest pain, palpitations and leg swelling.  Gastrointestinal: Positive for abdominal pain. Negative for diarrhea and vomiting.  Genitourinary: Negative for dysuria, flank pain and testicular pain.  Musculoskeletal: Negative for back pain and neck pain.  Skin: Negative for rash.  Neurological: Negative for headaches.  Hematological: Does not bruise/bleed easily.  Psychiatric/Behavioral: Negative for confusion.     Physical Exam Updated Vital Signs BP 120/78 (BP Location: Right Arm)   Pulse 96   Temp 99 F (37.2 C) (Oral)   Resp 16   Ht 1.88 m (6\' 2" )   Wt 84.8 kg (187 lb)   SpO2 95%   BMI 24.01 kg/m   Physical Exam  Constitutional: He appears well-developed and well-nourished. No distress.  HENT:  Mouth/Throat: Oropharynx is clear and moist.  Eyes: Conjunctivae  are normal.  Neck: Neck supple. No tracheal deviation present. No thyromegaly present.  Cardiovascular: Normal rate, regular rhythm, normal heart sounds and intact distal pulses. Exam reveals no gallop and no friction rub.  No murmur heard. Pulmonary/Chest: Effort normal and breath sounds normal. No accessory muscle usage. No respiratory distress.  Abdominal: Soft. Bowel sounds are normal. He exhibits no distension and no mass. There is tenderness. There is no rebound and no  guarding. No hernia.  bil lower abd tenderness, right > L  Genitourinary:  Genitourinary Comments: No cva tenderness. No scrotal or testicular pain, swelling, or tenderness.   Musculoskeletal: He exhibits no edema.  Neurological: He is alert.  Skin: Skin is warm and dry. No rash noted. He is not diaphoretic.  Psychiatric: He has a normal mood and affect.  Nursing note and vitals reviewed.    ED Treatments / Results  Labs (all labs ordered are listed, but only abnormal results are displayed) Results for orders placed or performed during the hospital encounter of 07/03/17  CBC  Result Value Ref Range   WBC 22.9 (H) 4.0 - 10.5 K/uL   RBC 4.88 4.22 - 5.81 MIL/uL   Hemoglobin 14.8 13.0 - 17.0 g/dL   HCT 16.1 09.6 - 04.5 %   MCV 89.8 78.0 - 100.0 fL   MCH 30.3 26.0 - 34.0 pg   MCHC 33.8 30.0 - 36.0 g/dL   RDW 40.9 81.1 - 91.4 %   Platelets 317 150 - 400 K/uL  Lipase, blood  Result Value Ref Range   Lipase 32 11 - 51 U/L  Comprehensive metabolic panel  Result Value Ref Range   Sodium 134 (L) 135 - 145 mmol/L   Potassium 4.4 3.5 - 5.1 mmol/L   Chloride 103 101 - 111 mmol/L   CO2 21 (L) 22 - 32 mmol/L   Glucose, Bld 135 (H) 65 - 99 mg/dL   BUN 14 6 - 20 mg/dL   Creatinine, Ser 7.82 0.61 - 1.24 mg/dL   Calcium 8.7 (L) 8.9 - 10.3 mg/dL   Total Protein 7.5 6.5 - 8.1 g/dL   Albumin 3.6 3.5 - 5.0 g/dL   AST 16 15 - 41 U/L   ALT 14 (L) 17 - 63 U/L   Alkaline Phosphatase 76 38 - 126 U/L   Total Bilirubin 0.8 0.3 - 1.2 mg/dL   GFR calc non Af Amer >60 >60 mL/min   GFR calc Af Amer >60 >60 mL/min   Anion gap 10 5 - 15  Urinalysis, Routine w reflex microscopic  Result Value Ref Range   Color, Urine STRAW (A) YELLOW   APPearance CLEAR CLEAR   Specific Gravity, Urine 1.006 1.005 - 1.030   pH 7.0 5.0 - 8.0   Glucose, UA NEGATIVE NEGATIVE mg/dL   Hgb urine dipstick NEGATIVE NEGATIVE   Bilirubin Urine NEGATIVE NEGATIVE   Ketones, ur NEGATIVE NEGATIVE mg/dL   Protein, ur NEGATIVE  NEGATIVE mg/dL   Nitrite NEGATIVE NEGATIVE   Leukocytes, UA NEGATIVE NEGATIVE  I-stat troponin, ED  Result Value Ref Range   Troponin i, poc 0.01 0.00 - 0.08 ng/mL   Comment 3           Dg Chest 2 View  Result Date: 07/03/2017 CLINICAL DATA:  Abdominal pain, productive cough with white sputum, shortness of breath and chest tightness since yesterday. History of COPD. Smoker. EXAM: CHEST - 2 VIEW COMPARISON:  Chest x-ray dated 06/21/2017 in chest x-ray dated 04/28/2017. FINDINGS: Heart size and mediastinal contours  are within normal limits. Aortic atherosclerosis. Lungs are hyperexpanded. Coarse interstitial lung markings are again seen bilaterally indicating chronic interstitial lung disease. Chronic bronchitic changes again noted centrally. No confluent opacity to suggest a developing pneumonia. No pleural effusion or pneumothorax seen. Mild degenerative spurring throughout the thoracic spine. No acute or suspicious osseous finding. IMPRESSION: 1. No active cardiopulmonary disease. No evidence of pneumonia or pulmonary edema. 2. Hyperexpanded lungs, consistent with the given history of COPD. There is some degree of associated chronic interstitial lung disease and chronic bronchitic changes. 3. Aortic atherosclerosis. Electronically Signed   By: Bary RichardStan  Maynard M.D.   On: 07/03/2017 12:55   Dg Chest 2 View  Result Date: 06/21/2017 CLINICAL DATA:  Cough for several weeks.  Former smoker. EXAM: CHEST - 2 VIEW COMPARISON:  04/28/2017 FINDINGS: Normal heart size. Aortic atherosclerosis noted. No pleural effusion or edema. Chronic interstitial coarsening is noted throughout both lungs with diffuse bronchial wall thickening. No superimposed airspace consolidation. IMPRESSION: 1. No acute cardiopulmonary abnormality. 2. Chronic interstitial coarsening. Electronically Signed   By: Signa Kellaylor  Stroud M.D.   On: 06/21/2017 09:45    EKG None  Radiology Dg Chest 2 View  Result Date: 07/03/2017 CLINICAL DATA:   Abdominal pain, productive cough with white sputum, shortness of breath and chest tightness since yesterday. History of COPD. Smoker. EXAM: CHEST - 2 VIEW COMPARISON:  Chest x-ray dated 06/21/2017 in chest x-ray dated 04/28/2017. FINDINGS: Heart size and mediastinal contours are within normal limits. Aortic atherosclerosis. Lungs are hyperexpanded. Coarse interstitial lung markings are again seen bilaterally indicating chronic interstitial lung disease. Chronic bronchitic changes again noted centrally. No confluent opacity to suggest a developing pneumonia. No pleural effusion or pneumothorax seen. Mild degenerative spurring throughout the thoracic spine. No acute or suspicious osseous finding. IMPRESSION: 1. No active cardiopulmonary disease. No evidence of pneumonia or pulmonary edema. 2. Hyperexpanded lungs, consistent with the given history of COPD. There is some degree of associated chronic interstitial lung disease and chronic bronchitic changes. 3. Aortic atherosclerosis. Electronically Signed   By: Bary RichardStan  Maynard M.D.   On: 07/03/2017 12:55    Procedures Procedures (including critical care time)  Medications Ordered in ED Medications  sodium chloride 0.9 % bolus 1,000 mL (has no administration in time range)     Initial Impression / Assessment and Plan / ED Course  I have reviewed the triage vital signs and the nursing notes.  Pertinent labs & imaging results that were available during my care of the patient were reviewed by me and considered in my medical decision making (see chart for details).  Iv ns bolus. Labs. Imaging studies.  cxr reviewed - no pna.   Labs reviewed - wbc is high.  Reviewed nursing notes and prior charts for additional history.   CT is pending.  Ct reviewed - no acute intrabd process - noted made of suspected pna.   Rocephin and zithromax iv.  Recheck, no increased wob.   Discussed ct with pt.   Pt appears stable for d/c.     Final Clinical  Impressions(s) / ED Diagnoses   Final diagnoses:  None    ED Discharge Orders    None       Cathren LaineSteinl, Brigitt Mcclish, MD 07/03/17 1944

## 2017-07-03 NOTE — Congregational Nurse Program (Signed)
Congregational Nurse Program Note  Date of Encounter: 06/23/2017  Past Medical History: Past Medical History:  Diagnosis Date  . COPD (chronic obstructive pulmonary disease) (HCC)     Encounter Details: CNP Questionnaire - 06/23/17 0900      Questionnaire   Patient Status  Not Applicable    Race  White or Caucasian    Location [facility] Served [facility]    Insurance  Not Applicable    Uninsured  Uninsured (NEW 1x/quarter)    Food  No food insecurities    Housing/Utilities  No permanent housing    Transportation  Yes, need transportation assistance    Interpersonal Safety  Yes, feel physically and emotionally safe where you currently live    Medication  Yes, have medication insecurities    Medical Provider  No    Referrals  Other    ED Visit Averted  Not Applicable    Life-Saving Intervention Made  Not Applicable      Medication given. Client states he feels better since he has been consistently using his inhaler. He will have follow up appointment at pulmonary clinic on 4/24.

## 2017-07-03 NOTE — ED Notes (Signed)
Pt departed in NAD.  

## 2017-07-03 NOTE — Discharge Instructions (Addendum)
It was our pleasure to provide your ER care today - we hope that you feel better.  Take antibiotics as prescribed.  Avoid any smoking.  Use albuterol inhaler as need.  Follow up with primary care doctor in the next few days for recheck.  Return to ER if worse, new symptoms, increased trouble breathing, other concern.

## 2017-07-05 ENCOUNTER — Encounter (HOSPITAL_COMMUNITY): Payer: Self-pay | Admitting: Emergency Medicine

## 2017-07-05 ENCOUNTER — Emergency Department (HOSPITAL_COMMUNITY)
Admission: EM | Admit: 2017-07-05 | Discharge: 2017-07-05 | Disposition: A | Discharge status: Home / Self Care | Payer: Self-pay | Attending: Emergency Medicine | Admitting: Emergency Medicine

## 2017-07-05 ENCOUNTER — Other Ambulatory Visit: Payer: Self-pay

## 2017-07-05 DIAGNOSIS — J189 Pneumonia, unspecified organism: Secondary | ICD-10-CM | POA: Insufficient documentation

## 2017-07-05 DIAGNOSIS — R300 Dysuria: Secondary | ICD-10-CM | POA: Insufficient documentation

## 2017-07-05 DIAGNOSIS — J449 Chronic obstructive pulmonary disease, unspecified: Secondary | ICD-10-CM | POA: Insufficient documentation

## 2017-07-05 DIAGNOSIS — F17210 Nicotine dependence, cigarettes, uncomplicated: Secondary | ICD-10-CM | POA: Insufficient documentation

## 2017-07-05 DIAGNOSIS — Z79899 Other long term (current) drug therapy: Secondary | ICD-10-CM | POA: Insufficient documentation

## 2017-07-05 LAB — URINALYSIS, ROUTINE W REFLEX MICROSCOPIC
Bilirubin Urine: NEGATIVE
GLUCOSE, UA: NEGATIVE mg/dL
HGB URINE DIPSTICK: NEGATIVE
Ketones, ur: NEGATIVE mg/dL
LEUKOCYTES UA: NEGATIVE
Nitrite: NEGATIVE
PROTEIN: NEGATIVE mg/dL
SPECIFIC GRAVITY, URINE: 1.003 — AB (ref 1.005–1.030)
pH: 7 (ref 5.0–8.0)

## 2017-07-05 LAB — LIPASE, BLOOD: LIPASE: 29 U/L (ref 11–51)

## 2017-07-05 LAB — COMPREHENSIVE METABOLIC PANEL
ALBUMIN: 3.6 g/dL (ref 3.5–5.0)
ALT: 14 U/L — ABNORMAL LOW (ref 17–63)
ANION GAP: 11 (ref 5–15)
AST: 18 U/L (ref 15–41)
Alkaline Phosphatase: 65 U/L (ref 38–126)
BILIRUBIN TOTAL: 0.9 mg/dL (ref 0.3–1.2)
BUN: 13 mg/dL (ref 6–20)
CHLORIDE: 104 mmol/L (ref 101–111)
CO2: 19 mmol/L — ABNORMAL LOW (ref 22–32)
Calcium: 8.7 mg/dL — ABNORMAL LOW (ref 8.9–10.3)
Creatinine, Ser: 0.97 mg/dL (ref 0.61–1.24)
GFR calc Af Amer: >60 mL/min (ref >60)
GFR calc non Af Amer: >60 mL/min (ref >60)
GLUCOSE: 135 mg/dL — AB (ref 65–99)
POTASSIUM: 4.3 mmol/L (ref 3.5–5.1)
Sodium: 134 mmol/L — ABNORMAL LOW (ref 135–145)
TOTAL PROTEIN: 7.2 g/dL (ref 6.5–8.1)

## 2017-07-05 LAB — CBC WITH DIFFERENTIAL/PLATELET
BASOS ABS: 0 10*3/uL (ref 0.0–0.1)
Basophils Relative: 0 %
Eosinophils Absolute: 0.1 10*3/uL (ref 0.0–0.7)
Eosinophils Relative: 1 %
HCT: 44.4 % (ref 39.0–52.0)
Hemoglobin: 15.1 g/dL (ref 13.0–17.0)
LYMPHS ABS: 2.1 10*3/uL (ref 0.7–4.0)
LYMPHS PCT: 20 %
MCH: 30.6 pg (ref 26.0–34.0)
MCHC: 34 g/dL (ref 30.0–36.0)
MCV: 90.1 fL (ref 78.0–100.0)
Monocytes Absolute: 0.6 10*3/uL (ref 0.1–1.0)
Monocytes Relative: 5 %
NEUTROS ABS: 8 10*3/uL (ref 1.7–7.7)
Neutrophils Relative %: 74 %
Platelets: 214 10*3/uL (ref 150–400)
RBC: 4.93 MIL/uL (ref 4.22–5.81)
RDW: 13.8 % (ref 11.5–15.5)
WBC: 10.8 10*3/uL — AB (ref 4.0–10.5)

## 2017-07-05 LAB — I-STAT CG4 LACTIC ACID, ED: Lactic Acid, Venous: 1 mmol/L (ref 0.5–1.9)

## 2017-07-05 MED ORDER — AMOXICILLIN 500 MG PO CAPS: 500.0000 mg | ORAL_CAPSULE | Freq: Three times a day (TID) | ORAL | Status: DC

## 2017-07-05 MED ORDER — GI COCKTAIL ~~LOC~~
30.0000 mL | Freq: Once | ORAL | Status: AC
  Administered 2017-07-05: 30 mL via ORAL
  Filled 2017-07-05: qty 30

## 2017-07-05 MED ORDER — IPRATROPIUM-ALBUTEROL 0.5-2.5 (3) MG/3ML IN SOLN
3.0000 mL | Freq: Once | RESPIRATORY_TRACT | Status: AC
  Administered 2017-07-05: 3 mL via RESPIRATORY_TRACT
  Filled 2017-07-05: qty 3

## 2017-07-05 MED ORDER — AZITHROMYCIN 250 MG PO TABS
500.0000 mg | ORAL_TABLET | Freq: Once | ORAL | Status: AC
  Administered 2017-07-05: 500 mg via ORAL
  Filled 2017-07-05: qty 2

## 2017-07-05 MED ORDER — OMEPRAZOLE 20 MG PO CPDR: 20.0000 mg | DELAYED_RELEASE_CAPSULE | Freq: Every day | ORAL | 0 refills | Status: DC

## 2017-07-05 MED ORDER — SODIUM CHLORIDE 0.9 % IV BOLUS
1000.0000 mL | Freq: Once | INTRAVENOUS | Status: AC
  Administered 2017-07-05: 1000 mL via INTRAVENOUS

## 2017-07-05 MED ORDER — AMOXICILLIN 250 MG PO CHEW
500.0000 mg | CHEWABLE_TABLET | Freq: Three times a day (TID) | ORAL | Status: DC
  Filled 2017-07-05 (×2): qty 2

## 2017-07-05 MED ORDER — ONDANSETRON HCL 4 MG/2ML IJ SOLN
4.0000 mg | Freq: Once | INTRAMUSCULAR | Status: AC
  Administered 2017-07-05: 4 mg via INTRAVENOUS
  Filled 2017-07-05: qty 2

## 2017-07-05 MED ORDER — AMOXICILLIN 250 MG/5ML PO SUSR
500.0000 mg | Freq: Once | ORAL | Status: AC
  Administered 2017-07-05: 500 mg via ORAL
  Filled 2017-07-05: qty 10

## 2017-07-05 MED ORDER — AMOXICILLIN 500 MG PO CAPS
500.0000 mg | ORAL_CAPSULE | Freq: Once | ORAL | Status: DC
  Filled 2017-07-05: qty 1

## 2017-07-05 NOTE — ED Notes (Signed)
Patient's pacemaker interrogated 

## 2017-07-05 NOTE — ED Triage Notes (Addendum)
Pt reports cough with mucous since 11th. Was seen here 4/12, diagnosed with pneumonia. Took azithromycin IV here, has prescriptions for inhaler, antibiotics due to "cannot fill at pharmacy until tomorrow." Pt has not taken any antibiotics since he left the hospital 12th due to "not being able to get them filled until Monday." Pt also states he has now had vomiting with lower abdominal burning, pain with urination, and nausea since Friday.

## 2017-07-05 NOTE — Discharge Instructions (Addendum)
As discussed, make sure that you take your entire course of antibiotics even if you feel better.  Take omeprazole 30 minutes before breakfast daily and follow-up with gastroenterology.  Stay well-hydrated and get some rest and follow-up with your primary care provider.  Return if symptoms worsen or new concerning symptoms in the meantime.

## 2017-07-05 NOTE — ED Notes (Signed)
Patient ambulatory to bathroom with steady gait at this time 

## 2017-07-05 NOTE — ED Notes (Signed)
Patient verbalizes understanding of discharge instructions. Opportunity for questioning and answers were provided. Armband removed by staff, pt discharged from ED ambulatory with cab voucher.   

## 2017-07-05 NOTE — ED Notes (Signed)
Previously filed note incorrect; pt states he does not have a pacemaker and it was not interrogated

## 2017-07-05 NOTE — ED Provider Notes (Signed)
[facility] EMERGENCY DEPARTMENT Provider Note   CSN: [id number] Arrival date & time: 07/05/17  1151     History   Chief Complaint Chief Complaint  Patient presents with  . Pneumonia  . Abdominal Pain    HPI Carlos Hudson is a 67 y.o. male past medical history of COPD presenting with diagnosed pneumonia 2 days ago and no antibiotics at home as he has not picked up his prescription.  He returns with burning on urination and nausea/vomiting. CT abdomen without acute abnormalities 2 days ago. He denies abdominal pain, last bowel movement last night nonbloody no diarrhea.  He denies any fever, chills.  No chest pain or shortness of breath.  No urinary symptoms. Hx of gastric ulcer, noncompliant with medications. Past surgical history include hernia repair.  HPI  Past Medical History:  Diagnosis Date  . COPD (chronic obstructive pulmonary disease) (HCC)     There are no active problems to display for this patient.   No past surgical history on file.      Home Medications    Prior to Admission medications   Medication Sig Start Date End Date Taking? Authorizing Provider  ibuprofen (ADVIL,MOTRIN) 200 MG tablet Take 400 mg by mouth every 12 (twelve) hours as needed (for pain).    Yes [provider]  albuterol (PROVENTIL HFA;VENTOLIN HFA) 108 (90 Base) MCG/ACT inhaler Inhale 1-2 puffs into the lungs every 6 (six) hours as needed for wheezing or shortness of breath. Patient not taking: Reported on 07/03/2017 04/28/17   Pisciotta, Joni Reining, PA-C  albuterol (PROVENTIL HFA;VENTOLIN HFA) 108 (90 Base) MCG/ACT inhaler Inhale 2 puffs into the lungs every 4 (four) hours as needed for wheezing or shortness of breath. 07/03/17   Cathren Laine, MD  amoxicillin (AMOXIL) 500 MG capsule Take 1 capsule (500 mg total) by mouth 3 (three) times daily. 07/03/17   Cathren Laine, MD  azithromycin (ZITHROMAX Z-PAK) 250 MG tablet Take 1 tablet (250 mg total) by mouth daily. Take as  directed 07/04/17   Cathren Laine, MD  azithromycin [facility]) 250 MG tablet Take two once then one daily until gone Patient not taking: Reported on 07/03/2017 06/22/17   Storm Frisk, MD  famotidine (PEPCID) 20 MG tablet Take 1 tablet (20 mg total) by mouth at bedtime. Patient not taking: Reported on 07/03/2017 06/22/17   Storm Frisk, MD  omeprazole (PRILOSEC) 20 MG capsule Take 1 capsule (20 mg total) by mouth daily. 07/05/17   Georgiana Shore, PA-C  predniSONE (DELTASONE) 10 MG tablet Take 4 for two days three for two days two for two days one for two days Patient not taking: Reported on 07/03/2017 06/22/17   Storm Frisk, MD    Family History No family history on file.  Social History Social History   Tobacco Use  . Smoking status: Current Every Day Smoker    Packs/day: 0.50    Types: Cigarettes  . Smokeless tobacco: Never Used  Substance Use Topics  . Alcohol use: No    Frequency: Never  . Drug use: No     Allergies   Clindamycin/lincomycin; Fish allergy; Iodinated diagnostic agents; Iodine; Aspirin; and Pepcid [famotidine]   Review of Systems Review of Systems  Constitutional: Negative for chills, diaphoresis, fatigue and fever.  HENT: Negative for congestion.   Respiratory: Negative for cough, choking, chest tightness, shortness of breath, wheezing and stridor.   Cardiovascular: Negative for chest pain, palpitations and leg swelling.  Gastrointestinal: Positive for nausea and vomiting.  Negative for abdominal distention, abdominal pain, blood in stool and diarrhea.  Genitourinary: Positive for dysuria. Negative for difficulty urinating and hematuria.  Musculoskeletal: Negative for arthralgias, back pain, gait problem, joint swelling, myalgias, neck pain and neck stiffness.  Skin: Negative for color change, pallor and rash.  Neurological: Negative for dizziness, seizures, syncope, weakness, light-headedness, numbness and headaches.  Psychiatric/Behavioral:  Negative for confusion.     Physical Exam Updated Vital Signs BP 121/71   Pulse 73   Temp 97.9 F (36.6 C) (Oral)   Resp 20   Ht 6\' 2"  (1.88 m)   Wt 84.8 kg (187 lb)   SpO2 96%   BMI 24.01 kg/m   Physical Exam  Constitutional: He appears well-developed and well-nourished.  Non-toxic appearance. He does not appear ill. No distress.  Afebrile, nontoxic-appearing, lying comfortably in bed no acute distress.  HENT:  Head: Normocephalic and atraumatic.  Eyes: Conjunctivae are normal.  Neck: Neck supple.  Cardiovascular: Normal rate and regular rhythm.  No murmur heard. Pulmonary/Chest: Effort normal. No respiratory distress. He has wheezes.  Decreased lung sounds in the right lower lobe.  Diffuse expiratory wheezing  Abdominal: Soft. Normal appearance and bowel sounds are normal. There is tenderness in the epigastric area. There is no rigidity, no rebound, no guarding, no CVA tenderness, no tenderness at McBurney's point and negative Murphy's sign.  Mild discomfort with deep palpation of the epigastrium.  No CVA tenderness.  Abdomen is otherwise soft and nontender.  Musculoskeletal: He exhibits no edema.  Neurological: He is alert.  Skin: Skin is warm and dry. No rash noted. He is not diaphoretic. No cyanosis () or erythema. No pallor.  Psychiatric: He has a normal mood and affect.  Nursing note and vitals reviewed.    ED Treatments / Results  Labs (all labs ordered are listed, but only abnormal results are displayed) Labs Reviewed  COMPREHENSIVE METABOLIC PANEL - Abnormal; Notable for the following components:      Result Value   Sodium 134 (*)    CO2 19 (*)    Glucose, Bld 135 (*)    Calcium 8.7 (*)    ALT 14 (*)    All other components within normal limits  URINALYSIS, ROUTINE W REFLEX MICROSCOPIC - Abnormal; Notable for the following components:   Color, Urine STRAW (*)    Specific Gravity, Urine 1.003 (*)    All other components within normal limits  CBC WITH  DIFFERENTIAL/PLATELET - Abnormal; Notable for the following components:   WBC 10.8 (*)    All other components within normal limits  LIPASE, BLOOD  I-STAT CG4 LACTIC ACID, ED    EKG EKG Interpretation  Date/Time:  Sunday July 05 2017 16:00:42 EDT Ventricular Rate:  75 PR Interval:    QRS Duration: 141 QT Interval:  422 QTC Calculation: 472 R Axis:   95 Text Interpretation:  Sinus rhythm RBBB and LPFB since last tracing no significant change Confirmed by Mancel Bale [phone]) on 07/05/2017 7:04:44 PM   Radiology No results found.  Procedures Procedures (including critical care time)  Medications Ordered in ED Medications  sodium chloride 0.9 % bolus 1,000 mL (0 mLs Intravenous Stopped 07/05/17 1800)  ondansetron (ZOFRAN) injection 4 mg (4 mg Intravenous Given 07/05/17 1642)  azithromycin (ZITHROMAX) tablet 500 mg (500 mg Oral Given 07/05/17 1642)  gi cocktail (Maalox,Lidocaine,Donnatal) (30 mLs Oral Given 07/05/17 1643)  ipratropium-albuterol (DUONEB) 0.5-2.5 (3) MG/3ML nebulizer solution 3 mL (3 mLs Nebulization Given 07/05/17 1642)  amoxicillin (AMOXIL) 250 MG/5ML  suspension 500 mg (500 mg Oral Given 07/05/17 1832)     Initial Impression / Assessment and Plan / ED Course  I have reviewed the triage vital signs and the nursing notes.  Pertinent labs & imaging results that were available during my care of the patient were reviewed by me and considered in my medical decision making (see chart for details).    Patient presents after being discharged from the emergency department 2 days ago with lower lobe pneumonia on CT with concerns that he was unable to obtain his medications including amoxicillin and azithromycin.  Given his first doses 2 days ago and missed dosing yesterday. He reports that he will be getting his prescriptions tomorrow Monday. He also reports burning on urination and nausea vomiting since last seen.  Patient has history of peptic ulcer but noncompliant  with medications.  On exam he is wheezing and decreased lung sounds in the right lower lobe.  Sats 99% on room air.  No increased work of breathing.  Will give DuoNeb, antibiotic dosing, and GI cocktail and reassess.  Labs unremarkable, white blood count 10.8, UA negative.  On reassessment, patient reported significant improvement after the GI cocktail.  Lung exam improved after nebs. Successful p.o. challenge, no nausea vomiting.  Urged patient to follow-up tomorrow to obtain his medications and take his entire course of antibiotics as prescribed.  Discharge home with symptomatic relief and close follow-up with GI and PCP.  Patient will be getting his antibiotics tomorrow and has received his prescribed dosing today here in the emergency department.  Discussed strict return precautions and advised to return to the emergency department if experiencing any new or worsening symptoms. Instructions were understood and patient agreed with discharge plan.  Final Clinical Impressions(s) / ED Diagnoses   Final diagnoses:  Community acquired pneumonia, unspecified laterality  Dysuria    ED Discharge Orders        Ordered    omeprazole (PRILOSEC) 20 MG capsule  Daily     07/05/17 1914       Gregary CromerMitchell, Jessica B, PA-C 07/05/17 1917    Mancel BaleWentz, Elliott, MD 07/06/17 1314

## 2017-07-05 NOTE — ED Provider Notes (Signed)
   Face-to-face evaluation   History: He presents for evaluation of upper abdominal pain, and cough.  Patient has also had some dysuria.  He describes having trouble swallowing pills.  Physical exam: Patient ambulates without dyspnea.  He is lucid.  Vital signs are reassuring.  Medical screening examination/treatment/procedure(s) were conducted as a shared visit with non-physician practitioner(s) and myself.  I personally evaluated the patient during the encounter    Mancel BaleWentz, Andee Chivers, MD 07/06/17 1314

## 2017-07-05 NOTE — ED Notes (Signed)
ED Provider at bedside. 

## 2017-07-06 ENCOUNTER — Encounter: Payer: Self-pay | Admitting: Pediatric Intensive Care

## 2017-07-06 MED FILL — VENTOLIN HFA 90 MCG INHALER: 108 (90 BAS | 17 days supply | Qty: 18 | Fill #0

## 2017-07-06 MED FILL — AZITHROMYCIN 250 MG TABLET: 250 | 4 days supply | Qty: 4 | Fill #0

## 2017-07-06 MED FILL — OMEPRAZOLE 20 MG CAP: 20 | 30 days supply | Qty: 30 | Fill #0

## 2017-07-06 MED FILL — AMOXICILLIN 500 MG CAPSULE: 500 | 7 days supply | Qty: 21 | Fill #0

## 2017-07-06 NOTE — Congregational Nurse Program (Signed)
Congregational Nurse Program Note  Date of Encounter: 07/06/2017  Past Medical History: Past Medical History:  Diagnosis Date  . COPD (chronic obstructive pulmonary disease) (HCC)     Encounter Details: CNP Questionnaire - 07/06/17 0845      Questionnaire   Patient Status  Not Applicable    Race  White or Caucasian    Location Patient Served At  [facility]    Insurance  Not Applicable    Uninsured  Uninsured (Subsequent visits/quarter)    Food  No food insecurities    Housing/Utilities  No permanent housing    Transportation  Yes, need transportation assistance    Interpersonal Safety  Yes, feel physically and emotionally safe where you currently live    Medication  Yes, have medication insecurities;Provided medication assistance    Medical Provider  No    Referrals  Medication Assistance    ED Visit Averted  Not Applicable    Life-Saving Intervention Made  Not Applicable      Client states that he was seen in ED last week and has prescriptions for antibiotics. He states that he has been diagnosed with pneumonia. BBS- decreased, poor air movement, rhonchi right side. CN will call [facility] to make ED follow up appointment.

## 2017-07-16 ENCOUNTER — Encounter (HOSPITAL_COMMUNITY): Payer: Self-pay | Admitting: Emergency Medicine

## 2017-07-16 ENCOUNTER — Emergency Department (HOSPITAL_COMMUNITY): Payer: Self-pay

## 2017-07-16 ENCOUNTER — Ambulatory Visit: Payer: Self-pay | Attending: Family Medicine | Admitting: Physician Assistant

## 2017-07-16 ENCOUNTER — Emergency Department (HOSPITAL_COMMUNITY)
Admission: EM | Admit: 2017-07-16 | Discharge: 2017-07-17 | Disposition: A | Discharge status: Home / Self Care | Payer: Self-pay | Attending: Emergency Medicine | Admitting: Emergency Medicine

## 2017-07-16 ENCOUNTER — Other Ambulatory Visit: Payer: Self-pay

## 2017-07-16 VITALS — BP 115/73 | HR 79 | Temp 98.3°F | Resp 18 | Ht 72.0 in | Wt 184.6 lb

## 2017-07-16 DIAGNOSIS — J189 Pneumonia, unspecified organism: Secondary | ICD-10-CM

## 2017-07-16 DIAGNOSIS — Z79899 Other long term (current) drug therapy: Secondary | ICD-10-CM | POA: Insufficient documentation

## 2017-07-16 DIAGNOSIS — Z881 Allergy status to other antibiotic agents status: Secondary | ICD-10-CM | POA: Insufficient documentation

## 2017-07-16 DIAGNOSIS — R21 Rash and other nonspecific skin eruption: Secondary | ICD-10-CM

## 2017-07-16 DIAGNOSIS — F17210 Nicotine dependence, cigarettes, uncomplicated: Secondary | ICD-10-CM | POA: Insufficient documentation

## 2017-07-16 DIAGNOSIS — J449 Chronic obstructive pulmonary disease, unspecified: Secondary | ICD-10-CM

## 2017-07-16 DIAGNOSIS — Z886 Allergy status to analgesic agent status: Secondary | ICD-10-CM | POA: Insufficient documentation

## 2017-07-16 DIAGNOSIS — Z888 Allergy status to other drugs, medicaments and biological substances status: Secondary | ICD-10-CM | POA: Insufficient documentation

## 2017-07-16 DIAGNOSIS — J181 Lobar pneumonia, unspecified organism: Secondary | ICD-10-CM | POA: Insufficient documentation

## 2017-07-16 DIAGNOSIS — F17200 Nicotine dependence, unspecified, uncomplicated: Secondary | ICD-10-CM

## 2017-07-16 LAB — GROUP A STREP BY PCR: Group A Strep by PCR: NOT DETECTED

## 2017-07-16 MED ORDER — OMEPRAZOLE 20 MG PO CPDR
20.0000 mg | DELAYED_RELEASE_CAPSULE | Freq: Every day | ORAL | 3 refills | Status: DC
Start: 2017-07-16 — End: 2018-10-06

## 2017-07-16 MED ORDER — BETAMETHASONE DIPROPIONATE 0.05 % EX CREA: TOPICAL_CREAM | Freq: Two times a day (BID) | CUTANEOUS | 0 refills | Status: DC

## 2017-07-16 MED ORDER — ALBUTEROL SULFATE HFA 108 (90 BASE) MCG/ACT IN AERS: 1.0000 | INHALATION_SPRAY | Freq: Four times a day (QID) | RESPIRATORY_TRACT | 3 refills | Status: DC | PRN

## 2017-07-16 NOTE — ED Triage Notes (Signed)
Pt c/o sore throat and productive cough. Denies chest pain/shortness of breath.

## 2017-07-16 NOTE — Patient Instructions (Signed)
Work on QUITTING smoking!!

## 2017-07-16 NOTE — Progress Notes (Signed)
Patient needs refill on ventolin inhaler

## 2017-07-16 NOTE — Progress Notes (Signed)
Carlos FallBenny Hudson  ZOX:[id number]  [id number]  DOB - 01/19/1951  Chief Complaint  Patient presents with  . Follow-up    pneumonia  . Rash       Subjective:   Carlos Hudson is a 67 y.o. male here today for establishment of care. He has a PMHx of COPD and smoking. He has presented to the ED 4 times this yr. 2/19-flu. 3/19-COPD exac. 4/19 twice with cough, sputum and SHOB. On 07/03/17 WBC 22.9. Treated with antibiotics and inhalers. He was unfortunately not able to get his script filled. 2 days later he re-presented. The cough was continuing but he also had some dysuria with a negative U/A. Continues to smoke.  Conservative measures ongoing. On a PPI and inhaler and is requesting refills on both. He also has a rash on lateral aspect of LLE calf area * 1 yr. Itchy at times. No pain. Has grown in size over the yr. Not treating with anything.  No other areas affected.   ROS: GEN: denies fever or chills, denies change in weight Skin: denies lesions + rashes HEENT: denies headache, earache, epistaxis, sore throat, or neck pain LUNGS: denies SHOB, dyspnea, PND, orthopnea CV: denies CP or palpitations +cough ABD: denies abd pain, N or V EXT: denies muscle spasms or swelling; no pain in lower ext, no weakness NEURO: denies numbness or tingling, denies sz, stroke or TIA  ALLERGIES: Allergies  Allergen Reactions  . Clindamycin/Lincomycin Nausea Only  . Fish Allergy Anaphylaxis    "Any freshwater fish that contains iodine"  . Iodinated Diagnostic Agents Anaphylaxis and Hives  . Iodine Anaphylaxis  . Aspirin Other (See Comments)    Patient has gastric ulcers  . Pepcid [Famotidine] Nausea Only    Nexium works better for him    PAST MEDICAL HISTORY: Past Medical History:  Diagnosis Date  . COPD (chronic obstructive pulmonary disease) (HCC)     PAST SURGICAL HISTORY: No past surgical history on file.  MEDICATIONS AT HOME: Prior to Admission medications   Medication Sig Start  Date End Date Taking? Authorizing Provider  albuterol (PROVENTIL HFA;VENTOLIN HFA) 108 (90 Base) MCG/ACT inhaler Inhale 2 puffs into the lungs every 4 (four) hours as needed for wheezing or shortness of breath. 07/03/17  Yes Cathren LaineSteinl, Kevin, MD  ibuprofen (ADVIL,MOTRIN) 200 MG tablet Take 400 mg by mouth every 12 (twelve) hours as needed (for pain).    Yes [provider]  omeprazole (PRILOSEC) 20 MG capsule Take 1 capsule (20 mg total) by mouth daily. 07/16/17  Yes Danelle EarthlyNoel, Kimiye Strathman S, PA-C  betamethasone dipropionate (DIPROLENE) 0.05 % cream Apply topically 2 (two) times daily. 07/16/17   Vivianne MasterNoel, Yordan Martindale S, PA-C    No family history on file.  @SOCIALHX @  Objective:   Vitals:   07/16/17 1406  BP: 115/73  Pulse: 79  Resp: 18  Temp: 98.3 F (36.8 C)  TempSrc: Oral  SpO2: 93%  Weight: 184 lb 9.6 oz (83.7 kg)  Height: 6' (1.829 m)    Exam General appearance : Awake, alert, not in any distress. Speech Clear. Not toxic looking HEENT: Atraumatic and Normocephalic, pupils equally reactive to light and accomodation Neck: supple, no JVD. No cervical lymphadenopathy.  Chest:Good air entry bilaterally, no added sounds  CVS: S1 S2 regular, no murmurs.  Abdomen: Bowel sounds present, Non tender and not distended with no guarding, rigidity or rebound. Extremities: B/L Lower Ext shows no edema, both legs are warm to touch Neurology: Awake alert, and oriented X 3, CN II-XII  intact, Non focal Skin:No Rash Wounds:N/A  Data Review No results found for: HGBA1C   Assessment & Plan  1. COPD  -refill inhaler  -refer to pulm since 2 ED visits within last mo  -smoking cessation encouraged; he is not ready to  QUIT 2. Smoker  -smoking cessation encouraged; he is not ready to  QUIT  3. Rash ? Fungal vs eczema  -steroid cream for now  -dermatology referral 4. PUD  -refill PPI  Return in about 1 month (around 08/13/2017).  The patient was given clear instructions to go to ER or return to  medical center if symptoms don't improve, worsen or new problems develop. The patient verbalized understanding. The patient was told to call to get lab results if they haven't heard anything in the next week.   Total time spent with patient was 39 min. Greater than 50 % of this visit was spent face to face counseling and coordinating care regarding risk factor modification, compliance importance and encouragement, education related to multi ED visits.  This note has been created with Education officer, environmental. Any transcriptional errors are unintentional.    Scot Jun, PA-C [facility] [place], [place] [phone]   07/16/2017, 2:53 PM

## 2017-07-17 MED ORDER — LEVOFLOXACIN 750 MG PO TABS: 750.0000 mg | ORAL_TABLET | Freq: Every day | ORAL | 0 refills | Status: DC

## 2017-07-17 MED ORDER — PREDNISONE 20 MG PO TABS: 40.0000 mg | ORAL_TABLET | Freq: Every day | ORAL | 0 refills | Status: DC

## 2017-07-17 NOTE — ED Notes (Signed)
ED Provider at bedside. 

## 2017-07-17 NOTE — ED Provider Notes (Signed)
[facility] EMERGENCY DEPARTMENT Provider Note   CSN: [id number] Arrival date & time: 07/16/17  2052     History   Chief Complaint Chief Complaint  Patient presents with  . Sore Throat  . Cough    HPI Carlos Hudson is a 67 y.o. male.  Patient reports that he has had sore throat, congestion for 1 week.  He has had a cough for longer than that, continues to have productive sputum.  He reports that he was seen in the ER for this and prescribed amoxicillin.  He recently finished but his cough has not improved.  He does use albuterol at home.  He has had some intermittent wheezing, but no significant shortness of breath.  He has not had a fever.     Past Medical History:  Diagnosis Date  . COPD (chronic obstructive pulmonary disease) (HCC)     There are no active problems to display for this patient.   History reviewed. No pertinent surgical history.      Home Medications    Prior to Admission medications   Medication Sig Start Date End Date Taking? Authorizing Provider  albuterol (PROVENTIL HFA;VENTOLIN HFA) 108 (90 Base) MCG/ACT inhaler Inhale 2 puffs into the lungs every 4 (four) hours as needed for wheezing or shortness of breath. 07/03/17   Cathren Laine, MD  betamethasone dipropionate (DIPROLENE) 0.05 % cream Apply topically 2 (two) times daily. 07/16/17   Vivianne Master, PA-C  ibuprofen (ADVIL,MOTRIN) 200 MG tablet Take 400 mg by mouth every 12 (twelve) hours as needed (for pain).     [provider]  omeprazole (PRILOSEC) 20 MG capsule Take 1 capsule (20 mg total) by mouth daily. 07/16/17   Vivianne Master, PA-C    Family History No family history on file.  Social History Social History   Tobacco Use  . Smoking status: Current Every Day Smoker    Packs/day: 0.50    Types: Cigarettes  . Smokeless tobacco: Never Used  Substance Use Topics  . Alcohol use: No    Frequency: Never  . Drug use: No     Allergies     Clindamycin/lincomycin; Fish allergy; Iodinated diagnostic agents; Iodine; Aspirin; and Pepcid [famotidine]   Review of Systems Review of Systems  HENT: Positive for sore throat.   Respiratory: Positive for cough.   All other systems reviewed and are negative.    Physical Exam Updated Vital Signs BP 110/73 (BP Location: Right Arm)   Pulse 64   Temp 97.8 F (36.6 C) (Oral)   Resp 20   SpO2 96%   Physical Exam  Constitutional: He is oriented to person, place, and time. He appears well-developed and well-nourished. No distress.  HENT:  Head: Normocephalic and atraumatic.  Right Ear: Hearing normal.  Left Ear: Hearing normal.  Nose: Nose normal.  Mouth/Throat: Oropharynx is clear and moist and mucous membranes are normal.  Eyes: Pupils are equal, round, and reactive to light. Conjunctivae and EOM are normal.  Neck: Normal range of motion. Neck supple.  Cardiovascular: Regular rhythm, S1 normal and S2 normal. Exam reveals no gallop and no friction rub.  No murmur heard. Pulmonary/Chest: Effort normal and breath sounds normal. No respiratory distress. He exhibits no tenderness.  Abdominal: Soft. Normal appearance and bowel sounds are normal. There is no hepatosplenomegaly. There is no tenderness. There is no rebound, no guarding, no tenderness at McBurney's point and negative Murphy's sign. No hernia.  Musculoskeletal: Normal range of motion.  Neurological: He  is alert and oriented to person, place, and time. He has normal strength. No cranial nerve deficit or sensory deficit. Coordination normal. GCS eye subscore is 4. GCS verbal subscore is 5. GCS motor subscore is 6.  Skin: Skin is warm, dry and intact. No rash noted. No cyanosis.  Psychiatric: He has a normal mood and affect. His speech is normal and behavior is normal. Thought content normal.  Nursing note and vitals reviewed.    ED Treatments / Results  Labs (all labs ordered are listed, but only abnormal results are  displayed) Labs Reviewed  GROUP A STREP BY PCR    EKG None  Radiology Dg Chest 2 View  Result Date: 07/16/2017 CLINICAL DATA:  Sore throat and productive cough.  Current smoker. EXAM: CHEST - 2 VIEW COMPARISON:  07/03/2017 FINDINGS: Heart size and pulmonary vascularity are normal. Central interstitial pattern to the lungs with peribronchial thickening suggesting chronic bronchitis. Pattern is similar to previous studies. There is suggestion of superimposed linear infiltration in the left lung base, new since previous study, which may indicate developing pneumonia or atelectasis. No pleural effusions. No pneumothorax. Mediastinal contours appear intact. Degenerative changes in the spine. IMPRESSION: Chronic interstitial pattern to the lungs consistent with chronic bronchitis. Superimposed linear infiltration in the left lung base, new since previous study may indicate developing pneumonia or atelectasis. Electronically Signed   By: Burman NievesWilliam  Stevens M.D.   On: 07/16/2017 22:46    Procedures Procedures (including critical care time)  Medications Ordered in ED Medications - No data to display   Initial Impression / Assessment and Plan / ED Course  I have reviewed the triage vital signs and the nursing notes.  Pertinent labs & imaging results that were available during my care of the patient were reviewed by me and considered in my medical decision making (see chart for details).     Patient presents with persistent cough.  He is a smoker.  X-ray does show chronic changes but there is also suggestion of superimposed pneumonia.  He is not experiencing any respiratory distress.  Room air oxygen saturation is 96%.  No active wheezing at this time.  He does use albuterol at home.  We will continue albuterol, short course of prednisone, Levaquin.  He just [facility] and also has follow-up with pulmonary scheduled in 1-1/2 weeks.  Final Clinical Impressions(s) /  ED Diagnoses   Final diagnoses:  Community acquired pneumonia of left lower lobe of lung [facility](HCC)    ED Discharge Orders    None       Pollina, Canary Brimhristopher J, MD 07/17/17 [phone]

## 2017-07-20 ENCOUNTER — Telehealth: Payer: Self-pay

## 2017-07-20 ENCOUNTER — Encounter: Payer: Self-pay | Admitting: Pediatric Intensive Care

## 2017-07-20 MED FILL — VENTOLIN HFA 90 MCG INHALER: 108 (90 BAS | 25 days supply | Qty: 18 | Fill #0

## 2017-07-20 MED FILL — levoFLOXacin 750 MG TABS: 750 | 5 days supply | Qty: 5 | Fill #0

## 2017-07-20 MED FILL — BETAMETHASONE DP 0.05% CRM: 0.05 | 30 days supply | Qty: 45 | Fill #0

## 2017-07-20 MED FILL — predniSONE 20 MG TABS: 20 | 5 days supply | Qty: 10 | Fill #0

## 2017-07-20 NOTE — Telephone Encounter (Signed)
Wife patient called regarding Levaquin medication that he was prescribed, patient do not want to take it because there is a lot of side effects, Please advised?

## 2017-07-20 NOTE — Congregational Nurse Program (Signed)
Congregational Nurse Program Note  Date of Encounter: 07/20/2017  Past Medical History: Past Medical History:  Diagnosis Date  . COPD (chronic obstructive pulmonary disease) (HCC)     Encounter Details: CNP Questionnaire - 07/20/17 0840      Questionnaire   Patient Status  Not Applicable    Race  White or Caucasian    Location Patient Served At  [employer]  Not Applicable    Uninsured  Uninsured (Subsequent visits/quarter)    Food  No food insecurities    Housing/Utilities  No permanent housing    Transportation  Yes, need transportation assistance    Interpersonal Safety  Yes, feel physically and emotionally safe where you currently live    Medication  Yes, have medication insecurities;Provided medication assistance    Medical Provider  Yes    Referrals  Medication Assistance    ED Visit Averted  Not Applicable    Life-Saving Intervention Made  Not Applicable      Clinical Intake - 07/16/17 1407      Pre-visit preparation   Pre-visit preparation completed  Yes      Pain   Pain   No/denies pain    Pain Score  0-No pain      Nutrition Screen   Diabetes  No      Functional Status   Activities of Daily Living  Independent    Ambulation  Independent    Medication Administration  Independent    Home Management  Independent      Risk/Barriers   Barriers to Care Management & Learning  None    Psychosocial Barriers  Financial need      Abuse/Neglect   Do you feel unsafe in your current relationship?  No    Do you feel physically threatened by others?  No    Anyone hurting you at home, work, or school?  No    Unable to ask?  No      Web designer Needed?  No     Client has completed PCP appointment. He was also seen in ED the following day for breathing issues and was diagnosed with LLL pneumonia. Client is also having a skin rash flare above his left ankle. This is not a new problem. BBS- decreased in left base with rhonchi noted. No  wheezing noted. Client given counseling regarding medications. CN will pick up medication at [facility]patient [facility].

## 2017-07-21 ENCOUNTER — Encounter (HOSPITAL_COMMUNITY): Payer: Self-pay

## 2017-07-21 ENCOUNTER — Other Ambulatory Visit: Payer: Self-pay

## 2017-07-21 ENCOUNTER — Emergency Department (HOSPITAL_COMMUNITY)
Admission: EM | Admit: 2017-07-21 | Discharge: 2017-07-22 | Disposition: A | Discharge status: Home / Self Care | Payer: Self-pay | Attending: Emergency Medicine | Admitting: Emergency Medicine

## 2017-07-21 ENCOUNTER — Emergency Department (HOSPITAL_COMMUNITY): Payer: Self-pay

## 2017-07-21 DIAGNOSIS — J189 Pneumonia, unspecified organism: Secondary | ICD-10-CM

## 2017-07-21 DIAGNOSIS — F17210 Nicotine dependence, cigarettes, uncomplicated: Secondary | ICD-10-CM | POA: Insufficient documentation

## 2017-07-21 DIAGNOSIS — J181 Lobar pneumonia, unspecified organism: Secondary | ICD-10-CM | POA: Insufficient documentation

## 2017-07-21 DIAGNOSIS — J449 Chronic obstructive pulmonary disease, unspecified: Secondary | ICD-10-CM | POA: Insufficient documentation

## 2017-07-21 LAB — CBC
HEMATOCRIT: 41.6 % (ref 39.0–52.0)
HEMOGLOBIN: 13.6 g/dL (ref 13.0–17.0)
MCH: 29.5 pg (ref 26.0–34.0)
MCHC: 32.7 g/dL (ref 30.0–36.0)
MCV: 90.2 fL (ref 78.0–100.0)
Platelets: 360 10*3/uL (ref 150–400)
RBC: 4.61 MIL/uL (ref 4.22–5.81)
RDW: 13.9 % (ref 11.5–15.5)
WBC: 8 10*3/uL (ref 4.0–10.5)

## 2017-07-21 LAB — BASIC METABOLIC PANEL
ANION GAP: 11 (ref 5–15)
BUN: 13 mg/dL (ref 6–20)
CHLORIDE: 104 mmol/L (ref 101–111)
CO2: 23 mmol/L (ref 22–32)
Calcium: 9 mg/dL (ref 8.9–10.3)
Creatinine, Ser: 1.19 mg/dL (ref 0.61–1.24)
GFR calc Af Amer: >60 mL/min (ref >60)
GFR calc non Af Amer: >60 mL/min (ref >60)
GLUCOSE: 262 mg/dL — AB (ref 65–99)
POTASSIUM: 3.9 mmol/L (ref 3.5–5.1)
Sodium: 138 mmol/L (ref 135–145)

## 2017-07-21 LAB — I-STAT TROPONIN, ED: Troponin i, poc: 0 ng/mL (ref 0.00–0.08)

## 2017-07-21 MED ORDER — AZITHROMYCIN 250 MG PO TABS
500.0000 mg | ORAL_TABLET | Freq: Once | ORAL | Status: AC
  Administered 2017-07-21: 500 mg via ORAL
  Filled 2017-07-21: qty 2

## 2017-07-21 MED ORDER — AMOXICILLIN 500 MG PO CAPS
500.0000 mg | ORAL_CAPSULE | Freq: Once | ORAL | Status: AC
  Administered 2017-07-21: 500 mg via ORAL
  Filled 2017-07-21: qty 1

## 2017-07-21 NOTE — ED Provider Notes (Signed)
TIME SEEN: 11:49 PM  CHIEF COMPLAINT: Chest pain, cough  HPI: Patient is a 67 year old male with history of COPD with recurrent community-acquired pneumonia, homelessness who presents emergency department today with continued cough and left lower chest pain with coughing.  States that he was seen on the 25th for pneumonia and put on Levaquin and prednisone.  States that he thinks the prednisone is making him worse so he plans to stop taking it.  States that he does not like Levaquin and has not filled this prescription and started this medication.  States he feels that amoxicillin would help with his symptoms.  No current chest pain.  Has chronic shortness of breath is unchanged.  No vomiting or diarrhea.  ROS: See HPI Constitutional: no fever  Eyes: no drainage  ENT: no runny nose   Cardiovascular:   chest pain  Resp: no SOB  GI: no vomiting GU: no dysuria Integumentary: no rash  Allergy: no hives  Musculoskeletal: no leg swelling  Neurological: no slurred speech ROS otherwise negative  PAST MEDICAL HISTORY/PAST SURGICAL HISTORY:  Past Medical History:  Diagnosis Date  . COPD (chronic obstructive pulmonary disease) (HCC)     MEDICATIONS:  Prior to Admission medications   Medication Sig Start Date End Date Taking? Authorizing Provider  albuterol (PROVENTIL HFA;VENTOLIN HFA) 108 (90 Base) MCG/ACT inhaler Inhale 2 puffs into the lungs every 4 (four) hours as needed for wheezing or shortness of breath. 07/03/17   Cathren Laine, MD  betamethasone dipropionate (DIPROLENE) 0.05 % cream Apply topically 2 (two) times daily. 07/16/17   Vivianne Master, PA-C  ibuprofen (ADVIL,MOTRIN) 200 MG tablet Take 400 mg by mouth every 12 (twelve) hours as needed (for pain).     [provider]  levofloxacin (LEVAQUIN) 750 MG tablet Take 1 tablet (750 mg total) by mouth daily. 07/17/17   Gilda Crease, MD  omeprazole (PRILOSEC) 20 MG capsule Take 1 capsule (20 mg total) by mouth daily.  07/16/17   Vivianne Master, PA-C  predniSONE (DELTASONE) 20 MG tablet Take 2 tablets (40 mg total) by mouth daily with breakfast. 07/17/17   Pollina, Canary Brim, MD    ALLERGIES:  Allergies  Allergen Reactions  . Clindamycin/Lincomycin Nausea Only  . Fish Allergy Anaphylaxis    "Any freshwater fish that contains iodine"  . Iodinated Diagnostic Agents Anaphylaxis and Hives  . Iodine Anaphylaxis  . Aspirin Other (See Comments)    Patient has gastric ulcers  . Pepcid [Famotidine] Nausea Only    Nexium works better for him    SOCIAL HISTORY:  Social History   Tobacco Use  . Smoking status: Current Every Day Smoker    Packs/day: 0.50    Types: Cigarettes  . Smokeless tobacco: Never Used  Substance Use Topics  . Alcohol use: No    Frequency: Never    FAMILY HISTORY: No family history on file.  EXAM: BP 118/62 (BP Location: Right Arm)   Pulse 86   Temp 98.1 F (36.7 C) (Oral)   Resp 18   Ht 6' (1.829 m)   Wt 83.5 kg (184 lb)   SpO2 96%   BMI 24.95 kg/m  CONSTITUTIONAL: Alert and oriented and responds appropriately to questions.  Chronically ill-appearing HEAD: Normocephalic EYES: Conjunctivae clear, pupils appear equal, EOMI ENT: normal nose; moist mucous membranes NECK: Supple, no meningismus, no nuchal rigidity, no LAD  CARD: RRR; S1 and S2 appreciated; no murmurs, no clicks, no rubs, no gallops RESP: Normal chest excursion without splinting or  tachypnea; breath sounds equal bilaterally.  No wheezing or rhonchi.  He does have some crackles at the left lower lung base heard posteriorly.  No hypoxia or respiratory distress, speaking full sentences ABD/GI: Normal bowel sounds; non-distended; soft, non-tender, no rebound, no guarding, no peritoneal signs, no hepatosplenomegaly BACK:  The back appears normal and is non-tender to palpation, there is no CVA tenderness EXT: Normal ROM in all joints; non-tender to palpation; no edema; normal capillary refill; no cyanosis, no  calf tenderness or swelling    SKIN: Normal color for age and race; warm; no rash NEURO: Moves all extremities equally PSYCH: The patient's mood and manner are appropriate. Grooming and personal hygiene are appropriate.  MEDICAL DECISION MAKING: Patient here with complaints of continued left-sided chest pain.  Was just diagnosed with community-acquired pneumonia in the left lower lung on April 25.  Has not been taking his antibiotics as he states he does not feel Levaquin would be helpful for him.  He is requesting amoxicillin.  We have discussed that amoxicillin would not give him appropriate coverage.  Also need azithromycin and he agrees to take this regimen.  His chest x-ray today shows again that he has airspace disease in the left base and at the lingula.  He has no hypoxia or respiratory distress and his lungs are relatively clear today.  I do not think he has having a COPD exacerbation.  I do not think this is a PE I do not think this is ACS.  His troponin is negative.  His EKG does not show any new ischemic abnormality.  He will be discharged with prescriptions of amoxicillin and azithromycin.  Patient and family are comfortable with this plan.  Discussed return precautions.  At this time, I do not feel there is any life-threatening condition present. I have reviewed and discussed all results (EKG, imaging, lab, urine as appropriate) and exam findings with patient/family. I have reviewed nursing notes and appropriate previous records.  I feel the patient is safe to be discharged home without further emergent workup and can continue workup as an outpatient as needed. Discussed usual and customary return precautions. Patient/family verbalize understanding and are comfortable with this plan.  Outpatient follow-up has been provided if needed. All questions have been answered.     EKG Interpretation  Date/Time:  Tuesday July 21 2017 19:14:18 EDT Ventricular Rate:  97 PR Interval:  162 QRS  Duration: 130 QT Interval:  376 QTC Calculation: 477 R Axis:   91 Text Interpretation:  Normal sinus rhythm Right bundle branch block Abnormal ECG No significant change since last tracing Reconfirmed by Ward, Baxter Hire [phone]) on 07/21/2017 11:50:25 PM         Ward, Layla Maw, DO 07/22/17 8413

## 2017-07-21 NOTE — ED Triage Notes (Signed)
Pt arrived from homeless [facility] via [facility]. Pt recently diagnosed with pneumonia. Pt reports being compliant with antibiotics. Pt reports chest pressure with exertion and deep inspiration.

## 2017-07-21 NOTE — Telephone Encounter (Signed)
Medications all have side effects; he was previously treated with amoxicillin with no improvement in symptoms and was subsequently prescribed Levaquin which I would recommend adherence with to ensure complete resolution of his symptoms.

## 2017-07-22 MED ORDER — AZITHROMYCIN 250 MG PO TABS: 250.0000 mg | ORAL_TABLET | Freq: Every day | ORAL | 0 refills | Status: DC

## 2017-07-22 MED ORDER — AMOXICILLIN 500 MG PO CAPS: 500.0000 mg | ORAL_CAPSULE | Freq: Three times a day (TID) | ORAL | 0 refills | Status: DC

## 2017-07-24 MED FILL — AZITHROMYCIN 250 MG TABLET: 250 | 5 days supply | Qty: 6 | Fill #0

## 2017-07-24 MED FILL — AMOXICILLIN 500 MG CAPSULE: 500 | 10 days supply | Qty: 30 | Fill #0

## 2017-07-29 ENCOUNTER — Ambulatory Visit: Payer: Self-pay | Admitting: Critical Care Medicine

## 2018-05-10 ENCOUNTER — Emergency Department (HOSPITAL_COMMUNITY)
Admission: EM | Admit: 2018-05-10 | Discharge: 2018-05-10 | Disposition: A | Discharge status: Home / Self Care | Payer: Medicare Other | Attending: Emergency Medicine | Admitting: Emergency Medicine

## 2018-05-10 ENCOUNTER — Emergency Department (HOSPITAL_COMMUNITY): Payer: Medicare Other

## 2018-05-10 ENCOUNTER — Encounter (HOSPITAL_COMMUNITY): Payer: Self-pay | Admitting: Emergency Medicine

## 2018-05-10 DIAGNOSIS — F17210 Nicotine dependence, cigarettes, uncomplicated: Secondary | ICD-10-CM | POA: Insufficient documentation

## 2018-05-10 DIAGNOSIS — Z7982 Long term (current) use of aspirin: Secondary | ICD-10-CM | POA: Diagnosis not present

## 2018-05-10 DIAGNOSIS — J440 Chronic obstructive pulmonary disease with acute lower respiratory infection: Secondary | ICD-10-CM | POA: Insufficient documentation

## 2018-05-10 DIAGNOSIS — J189 Pneumonia, unspecified organism: Secondary | ICD-10-CM | POA: Diagnosis not present

## 2018-05-10 DIAGNOSIS — J441 Chronic obstructive pulmonary disease with (acute) exacerbation: Secondary | ICD-10-CM

## 2018-05-10 DIAGNOSIS — R0789 Other chest pain: Secondary | ICD-10-CM | POA: Diagnosis present

## 2018-05-10 LAB — CBC WITH DIFFERENTIAL/PLATELET
Abs Immature Granulocytes: 0.09 10*3/uL — ABNORMAL HIGH (ref 0.00–0.07)
BASOS ABS: 0.1 10*3/uL (ref 0.0–0.1)
BASOS PCT: 1 %
EOS ABS: 0.5 10*3/uL (ref 0.0–0.5)
EOS PCT: 4 %
HCT: 47.3 % (ref 39.0–52.0)
Hemoglobin: 15.6 g/dL (ref 13.0–17.0)
IMMATURE GRANULOCYTES: 1 %
Lymphocytes Relative: 16 %
Lymphs Abs: 2.1 10*3/uL (ref 0.7–4.0)
MCH: 29.9 pg (ref 26.0–34.0)
MCHC: 33 g/dL (ref 30.0–36.0)
MCV: 90.8 fL (ref 80.0–100.0)
Monocytes Absolute: 1.3 10*3/uL — ABNORMAL HIGH (ref 0.1–1.0)
Monocytes Relative: 10 %
NEUTROS PCT: 68 %
NRBC: 0 % (ref 0.0–0.2)
Neutro Abs: 9.3 10*3/uL — ABNORMAL HIGH (ref 1.7–7.7)
PLATELETS: 303 10*3/uL (ref 150–400)
RBC: 5.21 MIL/uL (ref 4.22–5.81)
RDW: 13.6 % (ref 11.5–15.5)
WBC: 13.4 10*3/uL — AB (ref 4.0–10.5)

## 2018-05-10 LAB — BASIC METABOLIC PANEL
ANION GAP: 9 (ref 5–15)
BUN: 12 mg/dL (ref 8–23)
CALCIUM: 9.1 mg/dL (ref 8.9–10.3)
CO2: 23 mmol/L (ref 22–32)
CREATININE: 1.2 mg/dL (ref 0.61–1.24)
Chloride: 103 mmol/L (ref 98–111)
Glucose, Bld: 132 mg/dL — ABNORMAL HIGH (ref 70–99)
Potassium: 4.2 mmol/L (ref 3.5–5.1)
SODIUM: 135 mmol/L (ref 135–145)

## 2018-05-10 LAB — I-STAT TROPONIN, ED: Troponin i, poc: 0 ng/mL (ref 0.00–0.08)

## 2018-05-10 MED ORDER — ALBUTEROL SULFATE HFA 108 (90 BASE) MCG/ACT IN AERS
1.0000 | INHALATION_SPRAY | Freq: Once | RESPIRATORY_TRACT | Status: AC
  Administered 2018-05-10: 1 via RESPIRATORY_TRACT
  Filled 2018-05-10: qty 6.7

## 2018-05-10 MED ORDER — PREDNISONE 10 MG PO TABS: 40.0000 mg | ORAL_TABLET | Freq: Every day | ORAL | 0 refills | Status: AC

## 2018-05-10 MED ORDER — DOXYCYCLINE HYCLATE 100 MG PO CAPS: 100.0000 mg | ORAL_CAPSULE | Freq: Two times a day (BID) | ORAL | 0 refills | Status: AC

## 2018-05-10 MED ORDER — IPRATROPIUM-ALBUTEROL 0.5-2.5 (3) MG/3ML IN SOLN
3.0000 mL | Freq: Once | RESPIRATORY_TRACT | Status: AC
  Administered 2018-05-10: 3 mL via RESPIRATORY_TRACT
  Filled 2018-05-10: qty 3

## 2018-05-10 MED ORDER — DOXYCYCLINE HYCLATE 100 MG PO TABS
100.0000 mg | ORAL_TABLET | Freq: Once | ORAL | Status: AC
  Administered 2018-05-10: 100 mg via ORAL
  Filled 2018-05-10: qty 1

## 2018-05-10 MED ORDER — PREDNISONE 20 MG PO TABS
60.0000 mg | ORAL_TABLET | Freq: Once | ORAL | Status: AC
  Administered 2018-05-10: 60 mg via ORAL
  Filled 2018-05-10: qty 3

## 2018-05-10 NOTE — Discharge Instructions (Signed)
Return to the ED if you start to have worsening chest pain, shortness of breath, vomiting or coughing up blood or leg swelling.

## 2018-05-10 NOTE — ED Triage Notes (Signed)
Pt here from home with c/o chest p[ain and cough times 1 month , pt is smoker and has history of copd

## 2018-05-10 NOTE — ED Provider Notes (Signed)
[facility] EMERGENCY DEPARTMENT Provider Note   CSN: [id number] Arrival date & time: 05/10/18  7846     History   Chief Complaint Chief Complaint  Patient presents with  . Chest Pain  . Cough    HPI Xsavior Aupperle is a 68 y.o. male with a past medical history of COPD who presents to ED for evaluation of 1 month history of intermittent chest pain, cough productive with white mucus.  Reports that the chest pain is sharp, will radiate to his abdomen but only when he is coughing does he have the pain.  He has tried his albuterol with improvement in his symptoms.  States that he was diagnosed with pneumonia as a child but chart review shows that he had a history of recurrent pneumonia several months ago.  He reports that his wife has been suffering from bronchitis.  He did not receive his influenza vaccine this year.  Denies any nausea, vomiting, changes to bowel movements, hemoptysis, leg swelling, chest pain at rest. He also complains of bilateral knee pain.  Describes the pain as aching.  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  Has a history of arthritis and feels that this is related to this.  Denies any trauma or injury, no prior fracture, dislocations or procedures in the area.  HPI  Past Medical History:  Diagnosis Date  . COPD (chronic obstructive pulmonary disease) (HCC)     There are no active problems to display for this patient.   History reviewed. No pertinent surgical history.      Home Medications    Prior to Admission medications   Medication Sig Start Date End Date Taking? Authorizing Provider  albuterol (PROVENTIL HFA;VENTOLIN HFA) 108 (90 Base) MCG/ACT inhaler Inhale 2 puffs into the lungs every 4 (four) hours as needed for wheezing or shortness of breath. 07/03/17  Yes Cathren Laine, MD  aspirin EC 81 MG tablet Take 162 mg by mouth daily.   Yes [provider]  ibuprofen  (ADVIL,MOTRIN) 200 MG tablet Take 400 mg by mouth every 12 (twelve) hours as needed for fever (for pain).    Yes [provider]  azithromycin (ZITHROMAX) 250 MG tablet Take 1 tablet (250 mg total) by mouth daily. Take first 2 tablets together, then 1 every day until finished. Patient not taking: Reported on 05/10/2018 07/22/17   Ward, Layla Maw, DO  betamethasone dipropionate (DIPROLENE) 0.05 % cream Apply topically 2 (two) times daily. Patient not taking: Reported on 05/10/2018 07/16/17   Vivianne Master, PA-C  doxycycline (VIBRAMYCIN) 100 MG capsule Take 1 capsule (100 mg total) by mouth 2 (two) times daily for 7 days. 05/10/18 05/17/18  Chancey Ringel, PA-C  levofloxacin (LEVAQUIN) 750 MG tablet Take 1 tablet (750 mg total) by mouth daily. Patient not taking: Reported on 05/10/2018 07/17/17   Gilda Crease, MD  omeprazole (PRILOSEC) 20 MG capsule Take 1 capsule (20 mg total) by mouth daily. Patient not taking: Reported on 05/10/2018 07/16/17   Vivianne Master, PA-C  predniSONE (DELTASONE) 10 MG tablet Take 4 tablets (40 mg total) by mouth daily for 4 days. 05/10/18 05/14/18  Dietrich Pates, PA-C    Family History History reviewed. No pertinent family history.  Social History Social History   Tobacco Use  . Smoking status: Current Every Day Smoker    Packs/day: 0.50    Types: Cigarettes  . Smokeless tobacco: Never Used  Substance Use Topics  . Alcohol use: No    Frequency: Never  . Drug use: No     Allergies   Clindamycin/lincomycin; Fish allergy; Iodinated diagnostic agents; Iodine; Aspirin; and Pepcid [famotidine]   Review of Systems Review of Systems  Constitutional: Negative for appetite change, chills and fever.  HENT: Negative for ear pain, rhinorrhea, sneezing and sore throat.   Eyes: Negative for photophobia and visual disturbance.  Respiratory: Positive for cough, shortness of breath and wheezing. Negative for chest tightness.   Cardiovascular: Positive for chest  pain. Negative for palpitations.  Gastrointestinal: Negative for abdominal pain, blood in stool, constipation, diarrhea, nausea and vomiting.  Genitourinary: Negative for dysuria, hematuria and urgency.  Musculoskeletal: Positive for arthralgias. Negative for myalgias.  Skin: Negative for rash.  Neurological: Negative for dizziness, weakness and light-headedness.     Physical Exam Updated Vital Signs BP 129/74 (BP Location: Right Arm)   Pulse 84   Temp (!) 97.4 F (36.3 C) (Oral)   Resp (!) 25   SpO2 100%   Physical Exam Vitals signs and nursing note reviewed.  Constitutional:      General: He is not in acute distress.    Appearance: He is well-developed.  HENT:     Head: Normocephalic and atraumatic.     Nose: Nose normal.  Eyes:     General: No scleral icterus.       Left eye: No discharge.     Conjunctiva/sclera: Conjunctivae normal.  Neck:     Musculoskeletal: Normal range of motion and neck supple.  Cardiovascular:     Rate and Rhythm: Normal rate and regular rhythm.     Heart sounds: Normal heart sounds. No murmur. No friction rub. No gallop.   Pulmonary:     Effort: Pulmonary effort is normal. No respiratory distress.     Breath sounds: Examination of the right-upper field reveals wheezing. Examination of the left-upper field reveals wheezing. Examination of the right-middle field reveals wheezing. Examination of the left-middle field reveals wheezing. Examination of the right-lower field reveals wheezing. Examination of the left-lower field reveals wheezing. Wheezing present.  Abdominal:     General: Bowel sounds are normal. There is no distension.     Palpations: Abdomen is soft.     Tenderness: There is no abdominal tenderness. There is no guarding.  Musculoskeletal: Normal range of motion.     Comments: No tenderness to palpation noted of knees.  No overlying skin changes, deformities noted.  Full active and passive range of motion of bilateral knees without  difficulty.  2+ DP pulse palpated.  No calf tenderness or erythema bilaterally.  Skin:    General: Skin is warm and dry.     Findings: No rash.  Neurological:     Mental Status: He is alert.     Motor: No abnormal muscle tone.     Coordination: Coordination normal.      ED Treatments / Results  Labs (all labs ordered are listed, but only abnormal results are displayed) Labs Reviewed  BASIC METABOLIC PANEL - Abnormal; Notable for the following components:      Result Value   Glucose, Bld 132 (*)    All other components within normal limits  CBC WITH DIFFERENTIAL/PLATELET - Abnormal; Notable for the following components:   WBC 13.4 (*)    Neutro Abs 9.3 (*)    Monocytes Absolute 1.3 (*)    Abs Immature Granulocytes 0.09 (*)    All other components within normal limits  I-STAT TROPONIN, ED    EKG EKG Interpretation  Date/Time:  Monday May 10 2018 09:13:03 EST Ventricular Rate:  90 PR Interval:    QRS Duration: 140 QT Interval:  384 QTC Calculation: 470 R Axis:   91 Text Interpretation:  Sinus rhythm RBBB and LPFB No significant change since last tracing Confirmed by Shaune PollackIsaacs, [place] [phone]) on 05/10/2018 9:15:05 AM   Radiology Dg Chest 2 View  Result Date: 05/10/2018 CLINICAL DATA:  Chest pain and cough 1 month. EXAM: CHEST - 2 VIEW COMPARISON:  04/28/2017 FINDINGS: Lungs are adequately inflated with airspace consolidation seen best anteriorly on the lateral film in the region of the right middle lobe or lingula. No effusion. Cardiomediastinal silhouette and remainder of the exam is unchanged. IMPRESSION: Airspace process over the anterior mid lungs on the lateral film likely either involving the right middle lobe or lingula and likely due to infection/pneumonia. Electronically Signed   By: Elberta Fortisaniel  Boyle M.D.   On: 05/10/2018 10:01    Procedures Procedures (including critical care time)  Medications Ordered in ED Medications  ipratropium-albuterol (DUONEB) 0.5-2.5  (3) MG/3ML nebulizer solution 3 mL (3 mLs Nebulization Given 05/10/18 0924)  predniSONE (DELTASONE) tablet 60 mg (60 mg Oral Given 05/10/18 1043)  doxycycline (VIBRA-TABS) tablet 100 mg (100 mg Oral Given 05/10/18 1043)  albuterol (PROVENTIL HFA;VENTOLIN HFA) 108 (90 Base) MCG/ACT inhaler 1 puff (1 puff Inhalation Given 05/10/18 1046)     Initial Impression / Assessment and Plan / ED Course  I have reviewed the triage vital signs and the nursing notes.  Pertinent labs & imaging results that were available during my care of the patient were reviewed by me and considered in my medical decision making (see chart for details).     68 year old male with past medical history of COPD presents to ED for 1 month history of intermittent chest pain, cough productive with mucus and wheezing.  Has had some improvement with his home albuterol but he since ran out several days ago.  Chest pain radiates down to the abdomen but only present when coughing.  He is complaining of bilateral knee pain which she believes is due to wearing his cowboy boots again.  On exam there is wheezing noted globally.  Vital signs are within normal limits.  He is not tachycardic, tachypneic or hypoxic.  No lower extremity edema, erythema or calf tenderness are concerning for DVT.  No changes to range of motion of knees and he denies any injury or trauma.  EKG shows no changes from prior tracings.  Troponin is negative.  BMP unremarkable.  CBC shows leukocytosis at 13.  Chest x-ray shows pneumonia.  Patient has significant improvement in symptoms with DuoNeb given here.  Breath sounds have improved bilaterally.  He is requesting discharge home, because "us country folk don't like being in the hospital unless we have to."  Will discharge home with remainder of steroid course, antibiotics and refill of albuterol.  Suspect COPD exacerbation in the setting of pneumonia.  We will have him follow-up with PCP and return to ED for any severe worsening  symptoms.  Patient is hemodynamically stable, in NAD, and able to ambulate in the ED. Evaluation does not show pathology that would require ongoing emergent intervention or inpatient treatment. I explained the diagnosis to the patient. Pain has been managed and has no complaints prior to discharge. Patient is comfortable with above plan and is stable for discharge at this time. All questions were answered prior to disposition. Strict  return precautions for returning to the ED were discussed. Encouraged follow up with PCP.    Portions of this note were generated with Scientist, clinical (histocompatibility and immunogenetics)Dragon dictation software. Dictation errors may occur despite best attempts at proofreading.  Final Clinical Impressions(s) / ED Diagnoses   Final diagnoses:  Community acquired pneumonia of right lung, unspecified part of lung  COPD exacerbation [facility](HCC)    ED Discharge Orders         Ordered    doxycycline (VIBRAMYCIN) 100 MG capsule  2 times daily     05/10/18 1057    predniSONE (DELTASONE) 10 MG tablet  Daily     05/10/18 1057           Dietrich PatesKhatri, Clayborn Milnes, PA-C 05/10/18 1059    Shaune PollackIsaacs, [place], MD 05/12/18 1351

## 2018-05-27 ENCOUNTER — Telehealth: Payer: Self-pay | Admitting: Internal Medicine

## 2018-05-27 NOTE — Telephone Encounter (Signed)
Patient's wife, Dewayne Hatch who is a current patient of yours, called asking if you would be will to see her husband to establish care (see message below).   Would you be willing to see him to establish care?

## 2018-05-27 NOTE — Telephone Encounter (Signed)
Copied from CRM [id number]. Topic: General - Other >> May 27, 2018  8:28 AM Elliot Gault wrote: Janlucas, [place] [phone] patient of Dr. Posey Rea referring spouse to establish care, please advise.

## 2018-06-03 NOTE — Telephone Encounter (Signed)
Okay.  Thanks.

## 2018-06-07 NOTE — Telephone Encounter (Signed)
Appointment scheduled.

## 2018-07-07 ENCOUNTER — Ambulatory Visit: Payer: Medicare Other | Admitting: Internal Medicine

## 2018-08-23 ENCOUNTER — Ambulatory Visit: Payer: Medicare Other | Admitting: Internal Medicine

## 2018-10-06 ENCOUNTER — Ambulatory Visit: Payer: Medicare Other | Admitting: Internal Medicine

## 2018-10-06 ENCOUNTER — Other Ambulatory Visit: Payer: Self-pay

## 2018-10-06 ENCOUNTER — Encounter: Payer: Self-pay | Admitting: Internal Medicine

## 2018-10-06 ENCOUNTER — Ambulatory Visit (INDEPENDENT_AMBULATORY_CARE_PROVIDER_SITE_OTHER): Payer: Medicare Other | Admitting: Internal Medicine

## 2018-10-06 ENCOUNTER — Other Ambulatory Visit (INDEPENDENT_AMBULATORY_CARE_PROVIDER_SITE_OTHER): Payer: Medicare Other

## 2018-10-06 VITALS — BP 124/68 | HR 71 | Temp 97.8°F | Ht 72.0 in | Wt 211.0 lb

## 2018-10-06 DIAGNOSIS — M170 Bilateral primary osteoarthritis of knee: Secondary | ICD-10-CM

## 2018-10-06 DIAGNOSIS — M1990 Unspecified osteoarthritis, unspecified site: Secondary | ICD-10-CM | POA: Insufficient documentation

## 2018-10-06 DIAGNOSIS — Z720 Tobacco use: Secondary | ICD-10-CM | POA: Diagnosis not present

## 2018-10-06 DIAGNOSIS — Z0000 Encounter for general adult medical examination without abnormal findings: Secondary | ICD-10-CM

## 2018-10-06 DIAGNOSIS — R0609 Other forms of dyspnea: Secondary | ICD-10-CM

## 2018-10-06 DIAGNOSIS — R635 Abnormal weight gain: Secondary | ICD-10-CM | POA: Insufficient documentation

## 2018-10-06 DIAGNOSIS — J449 Chronic obstructive pulmonary disease, unspecified: Secondary | ICD-10-CM

## 2018-10-06 LAB — LIPID PANEL
Cholesterol: 165 mg/dL (ref 0–200)
HDL: 33.4 mg/dL — ABNORMAL LOW (ref >39.00)
LDL Cholesterol: 109 mg/dL — ABNORMAL HIGH (ref 0–99)
NonHDL: 131.11
Total CHOL/HDL Ratio: 5
Triglycerides: 110 mg/dL (ref 0.0–149.0)
VLDL: 22 mg/dL (ref 0.0–40.0)

## 2018-10-06 LAB — CBC WITH DIFFERENTIAL/PLATELET
Basophils Absolute: 0.1 10*3/uL (ref 0.0–0.1)
Basophils Relative: 0.9 % (ref 0.0–3.0)
Eosinophils Absolute: 0.4 10*3/uL (ref 0.0–0.7)
Eosinophils Relative: 5.3 % — ABNORMAL HIGH (ref 0.0–5.0)
HCT: 47.7 % (ref 39.0–52.0)
Hemoglobin: 15.8 g/dL (ref 13.0–17.0)
Lymphocytes Relative: 29.6 % (ref 12.0–46.0)
Lymphs Abs: 2.4 10*3/uL (ref 0.7–4.0)
MCHC: 33.1 g/dL (ref 30.0–36.0)
MCV: 91.4 fl (ref 78.0–100.0)
Monocytes Absolute: 0.6 10*3/uL (ref 0.1–1.0)
Monocytes Relative: 7.2 % (ref 3.0–12.0)
Neutro Abs: 4.6 10*3/uL (ref 1.4–7.7)
Neutrophils Relative %: 57 % (ref 43.0–77.0)
Platelets: 302 10*3/uL (ref 150.0–400.0)
RBC: 5.22 Mil/uL (ref 4.22–5.81)
RDW: 14.3 % (ref 11.5–15.5)
WBC: 8 10*3/uL (ref 4.0–10.5)

## 2018-10-06 LAB — BASIC METABOLIC PANEL
BUN: 15 mg/dL (ref 6–23)
CO2: 29 mEq/L (ref 19–32)
Calcium: 9.8 mg/dL (ref 8.4–10.5)
Chloride: 102 mEq/L (ref 96–112)
Creatinine, Ser: 1.08 mg/dL (ref 0.40–1.50)
GFR: 67.91 mL/min (ref >60.00)
Glucose, Bld: 120 mg/dL — ABNORMAL HIGH (ref 70–99)
Potassium: 5 mEq/L (ref 3.5–5.1)
Sodium: 138 mEq/L (ref 135–145)

## 2018-10-06 LAB — HEPATIC FUNCTION PANEL
ALT: 25 U/L (ref 0–53)
AST: 22 U/L (ref 0–37)
Albumin: 4.5 g/dL (ref 3.5–5.2)
Alkaline Phosphatase: 70 U/L (ref 39–117)
Bilirubin, Direct: 0.1 mg/dL (ref 0.0–0.3)
Total Bilirubin: 0.6 mg/dL (ref 0.2–1.2)
Total Protein: 7.5 g/dL (ref 6.0–8.3)

## 2018-10-06 LAB — TSH: TSH: 3.09 u[IU]/mL (ref 0.35–4.50)

## 2018-10-06 LAB — PSA: PSA: 2.53 ng/mL (ref 0.10–4.00)

## 2018-10-06 MED ORDER — SILDENAFIL CITRATE 100 MG PO TABS: 50.0000 mg | ORAL_TABLET | Freq: Every day | ORAL | 5 refills | Status: DC | PRN

## 2018-10-06 MED ORDER — ALBUTEROL SULFATE HFA 108 (90 BASE) MCG/ACT IN AERS: 2.0000 | INHALATION_SPRAY | RESPIRATORY_TRACT | 3 refills | Status: DC | PRN

## 2018-10-06 NOTE — Patient Instructions (Addendum)
If you have medicare related insurance (such as traditional Medicare, [employer], [employer], or similar), Please make an appointment at the scheduling desk with Sharee Pimple, the [employer], for your Wellness visit in this office, which is a benefit with your insurance.    These suggestions will probably help you to lose weight: 1.  Reduce your consumption of sugars and starches.  Eliminate high fructose corn syrup from your diet.  Reduce your consumption of processed foods.  For desserts try to have seasonal fruits, berries with with green, knots, cheeses or dark chocolate with more than 70% cacao. 2.  Do not snack 3.  You do not have to eat breakfast.  If you choose to have breakfast-eat plain greek yogurt, eggs, oatmeal (without sugar) 4.  Drink water, freshly brewed unsweetened tea (green, black or herbal) or coffee.  Do not drink sodas including diet sodas , juices, beverages sweetened with artificial sweeteners. 5.  Reduce your consumption of refined grains. 6.  Avoid protein drinks such as Optifast, Slim fast etc. Eat chicken, fish, meat, dairy and beings for your sources of protein 7.  Natural unprocessed fats like cold pressed virgin olive oil, butter, coconut oil are good for you.  Eat avocados 8.  Increase your consumption of fiber.  Fruits, berries, vegetables, whole grains, flaxseeds, Chia seeds, beans, popcorn, nuts, oatmeal are good sources of fiber 9.  Use vinegar in your diet, i.e. apple cider vinegar, red wine or balsamic vinegar 10.  You can try fasting.  For example you can skip breakfast and lunch every other day (24-hour fast)    Mediterranean diet is good for you.  If you drink alcohol, limit your alcohol intake to no more than 2 drinks a day. The Mediterranean diet is a way of eating based on the traditional cuisine of countries bordering the [employer]. While there is no single definition of the Mediterranean diet, it is typically high in  vegetables, fruits, whole grains, beans, nut and seeds, and olive oil. The main components of Mediterranean diet include: Marland Kitchen Daily consumption of vegetables, fruits, whole grains and healthy fats  . Weekly intake of fish, poultry, beans and eggs  . Moderate portions of dairy products  . Limited intake of red meat Other important elements of the Mediterranean diet are sharing meals with family and friends, enjoying a glass of red wine and being physically active.  Cabbage soup recipe for losing weight: Take 1 small head of CABG, 1 average pack of celery, 4 green peppers, 4 onions, 2 cans diced tomatoes, salt and spices to taste.  Chop cabbage, celery, peppers and onions.  And tomatoes and 2-2.5 liters (2.5 quarts) of water so that it would just cover the vegetables.  Bring to boil.  Add spices and salt.  Turn heat to low/medium and simmer for 20-25 minutes.   Cardiac CT calcium scoring test $150   Computed tomography, more commonly known as a CT or CAT scan, is a diagnostic medical imaging test. Like traditional x-rays, it produces multiple images or pictures of the inside of the body. The cross-sectional images generated during a CT scan can be reformatted in multiple planes. They can even generate three-dimensional images. These images can be viewed on a computer monitor, printed on film or by a 3D printer, or transferred to a CD or DVD. CT images of internal organs, bones, soft tissue and blood vessels provide greater detail than traditional x-rays, particularly of soft tissues and blood vessels. A  cardiac CT scan for coronary calcium is a non-invasive way of obtaining information about the presence, location and extent of calcified plaque in the coronary arteries-the vessels that supply oxygen-containing blood to the heart muscle. Calcified plaque results when there is a build-up of fat and other substances under the inner layer of the artery. This material can calcify which signals the presence  of atherosclerosis, a disease of the vessel wall, also called coronary artery disease (CAD). People with this disease have an increased risk for heart attacks. In addition, over time, progression of plaque build up (CAD) can narrow the arteries or even close off blood flow to the heart. The result may be chest pain, sometimes called "angina," or a heart attack. Because calcium is a marker of CAD, the amount of calcium detected on a cardiac CT scan is a helpful prognostic tool. The findings on cardiac CT are expressed as a calcium score. Another name for this test is coronary artery calcium scoring.  What are some common uses of the procedure? The goal of cardiac CT scan for calcium scoring is to determine if CAD is present and to what extent, even if there are no symptoms. It is a screening study that may be recommended by a physician for patients with risk factors for CAD but no clinical symptoms. The major risk factors for CAD are: . high blood cholesterol levels  . family history of heart attacks  . diabetes  . high blood pressure  . cigarette smoking  . overweight or obese  . physical inactivity   A negative cardiac CT scan for calcium scoring shows no calcification within the coronary arteries. This suggests that CAD is absent or so minimal it cannot be seen by this technique. The chance of having a heart attack over the next two to five years is very low under these circumstances. A positive test means that CAD is present, regardless of whether or not the patient is experiencing any symptoms. The amount of calcification-expressed as the calcium score-may help to predict the likelihood of a myocardial infarction (heart attack) in the coming years and helps your medical doctor or cardiologist decide whether the patient may need to take preventive medicine or undertake other measures such as diet and exercise to lower the risk for heart attack. The extent of CAD is graded according to your  calcium score:  Calcium Score  Presence of CAD (coronary artery disease)  0 No evidence of CAD   1-10 Minimal evidence of CAD  11-100 Mild evidence of CAD  101-400 Moderate evidence of CAD  Over 400 Extensive evidence of CAD

## 2018-10-06 NOTE — Assessment & Plan Note (Signed)
We discussed age appropriate health related issues, including available/recomended screening tests and vaccinations. We discussed a need for adhering to healthy diet and exercise. Labs were ordered to be later reviewed . All questions were answered. A  cardiac CT scan for calcium scoring offered Refused shots Cologuard

## 2018-10-06 NOTE — Progress Notes (Signed)
Subjective:  Patient ID: Carlos Hudson, male    DOB: 05/08/1950  Age: 68 y.o. MRN: [id number]  CC: No chief complaint on file.   HPI Carlos Hudson presents for a new pt visit - well exam: The patient is c/o wt gain during COVID long down. C/o COPD -he gets winded when taking stairs. DOE under other circumstances. C/o occasional ED, happening over past few months.  She has not tried any meds for it. He is a heavy smoker 2ppd >50 years.  No interest to quit  Outpatient Medications Prior to Visit  Medication Sig Dispense Refill  . albuterol (PROVENTIL HFA;VENTOLIN HFA) 108 (90 Base) MCG/ACT inhaler Inhale 2 puffs into the lungs every 4 (four) hours as needed for wheezing or shortness of breath. (Patient not taking: Reported on 10/06/2018) 1 Inhaler 1  . aspirin EC 81 MG tablet Take 162 mg by mouth daily.    Marland Kitchen. azithromycin (ZITHROMAX) 250 MG tablet Take 1 tablet (250 mg total) by mouth daily. Take first 2 tablets together, then 1 every day until finished. (Patient not taking: Reported on 05/10/2018) 6 tablet 0  . betamethasone dipropionate (DIPROLENE) 0.05 % cream Apply topically 2 (two) times daily. (Patient not taking: Reported on 05/10/2018) 30 g 0  . ibuprofen (ADVIL,MOTRIN) 200 MG tablet Take 400 mg by mouth every 12 (twelve) hours as needed for fever (for pain).     Marland Kitchen. levofloxacin (LEVAQUIN) 750 MG tablet Take 1 tablet (750 mg total) by mouth daily. (Patient not taking: Reported on 05/10/2018) 5 tablet 0  . omeprazole (PRILOSEC) 20 MG capsule Take 1 capsule (20 mg total) by mouth daily. (Patient not taking: Reported on 05/10/2018) 30 capsule 3   No facility-administered medications prior to visit.     ROS: Review of Systems  Constitutional: Negative for appetite change, fatigue and unexpected weight change.  HENT: Negative for congestion, nosebleeds, sneezing, sore throat and trouble swallowing.   Eyes: Negative for itching and visual disturbance.  Respiratory: Positive for cough and  wheezing.   Cardiovascular: Negative for chest pain, palpitations and leg swelling.  Gastrointestinal: Negative for abdominal distention, blood in stool, diarrhea and nausea.  Genitourinary: Negative for frequency and hematuria.  Musculoskeletal: Negative for back pain, gait problem, joint swelling and neck pain.  Skin: Negative for rash.  Neurological: Negative for dizziness, tremors, speech difficulty and weakness.  Psychiatric/Behavioral: Negative for agitation, dysphoric mood, sleep disturbance and suicidal ideas. The patient is not nervous/anxious.     Objective:  BP 124/68 (BP Location: Left Arm, Patient Position: Sitting, Cuff Size: Large)   Pulse 71   Temp 97.8 F (36.6 C) (Oral)   Ht 6' (1.829 m)   Wt 211 lb (95.7 kg)   SpO2 98%   BMI 28.62 kg/m   BP Readings from Last 3 Encounters:  10/06/18 124/68  05/10/18 129/74  07/22/17 108/62    Wt Readings from Last 3 Encounters:  10/06/18 211 lb (95.7 kg)  07/21/17 184 lb (83.5 kg)  07/16/17 184 lb 9.6 oz (83.7 kg)    Physical Exam Constitutional:      General: He is not in acute distress.    Appearance: He is well-developed.     Comments: NAD  Eyes:     Conjunctiva/sclera: Conjunctivae normal.     Pupils: Pupils are equal, round, and reactive to light.  Neck:     Musculoskeletal: Normal range of motion.     Thyroid: No thyromegaly.     Vascular: No JVD.  Cardiovascular:  Rate and Rhythm: Normal rate and regular rhythm.     Heart sounds: Normal heart sounds. No murmur. No friction rub. No gallop.   Pulmonary:     Effort: Pulmonary effort is normal. No respiratory distress.     Breath sounds: Wheezing present. No rales.  Chest:     Chest wall: No tenderness.  Abdominal:     General: Bowel sounds are normal. There is no distension.     Palpations: Abdomen is soft. There is no mass.     Tenderness: There is no abdominal tenderness. There is no guarding or rebound.  Musculoskeletal: Normal range of motion.         General: No tenderness.  Lymphadenopathy:     Cervical: No cervical adenopathy.  Skin:    General: Skin is warm and dry.     Findings: No rash.  Neurological:     Mental Status: He is alert and oriented to person, place, and time.     Cranial Nerves: No cranial nerve deficit.     Motor: No abnormal muscle tone.     Coordination: Coordination normal.     Gait: Gait normal.     Deep Tendon Reflexes: Reflexes are normal and symmetric.  Psychiatric:        Behavior: Behavior normal.        Thought Content: Thought content normal.        Judgment: Judgment normal.   rectal - pt declined Midline hernia  Procedure: EKG Indication: DOE Impression: NSR. RBBB. No acute changes.   Lab Results  Component Value Date   WBC 13.4 (H) 05/10/2018   HGB 15.6 05/10/2018   HCT 47.3 05/10/2018   PLT 303 05/10/2018   GLUCOSE 132 (H) 05/10/2018   ALT 14 (L) 07/05/2017   AST 18 07/05/2017   NA 135 05/10/2018   K 4.2 05/10/2018   CL 103 05/10/2018   CREATININE 1.20 05/10/2018   BUN 12 05/10/2018   CO2 23 05/10/2018    Dg Chest 2 View  Result Date: 05/10/2018 CLINICAL DATA:  Chest pain and cough 1 month. EXAM: CHEST - 2 VIEW COMPARISON:  04/28/2017 FINDINGS: Lungs are adequately inflated with airspace consolidation seen best anteriorly on the lateral film in the region of the right middle lobe or lingula. No effusion. Cardiomediastinal silhouette and remainder of the exam is unchanged. IMPRESSION: Airspace process over the anterior mid lungs on the lateral film likely either involving the right middle lobe or lingula and likely due to infection/pneumonia. Electronically Signed   By: Marin Olp M.D.   On: 05/10/2018 10:01    Assessment & Plan:   There are no diagnoses linked to this encounter.   No orders of the defined types were placed in this encounter.    Follow-up: No follow-ups on file.  Walker Kehr, MD

## 2018-10-06 NOTE — Assessment & Plan Note (Signed)
Labs

## 2018-10-06 NOTE — Assessment & Plan Note (Signed)
Smoker 2ppd >50 years

## 2018-10-11 NOTE — Assessment & Plan Note (Signed)
Discussed.  He needs to quit smoking. Albuterol MDI

## 2018-10-11 NOTE — Assessment & Plan Note (Signed)
EKG obtained He needs to cut back on smoking A cardiac CT scan for coronary calcium test was suggested Obtain lab work

## 2018-10-19 ENCOUNTER — Other Ambulatory Visit: Payer: Self-pay | Admitting: Internal Medicine

## 2018-10-19 DIAGNOSIS — G8929 Other chronic pain: Secondary | ICD-10-CM

## 2018-10-19 DIAGNOSIS — M25562 Pain in left knee: Secondary | ICD-10-CM

## 2018-10-28 ENCOUNTER — Telehealth: Payer: Self-pay | Admitting: Internal Medicine

## 2018-10-28 MED ORDER — AMOXICILLIN 500 MG PO CAPS: 500.0000 mg | ORAL_CAPSULE | Freq: Three times a day (TID) | ORAL | 0 refills | Status: DC

## 2018-10-28 NOTE — Telephone Encounter (Signed)
  Ok ---------------------------------------   Dr. Alain Marion,  My husband, Cahlil Sattar, asked me to contact you requesting an Rx for amoxicillin to clear up his chest. I believe he mentioned that he was coughing up some phlegm (I believe he said it was clear-yellow in color) on his ov of 07/15. He just forgot to mention that amoxicillin is the only thing that clears up his chest. He uses the [facility] on [address].  If you need to talk to him, call him at [phone].  Thank you.  Mrs. Lynnae January

## 2018-10-29 ENCOUNTER — Telehealth: Payer: Self-pay | Admitting: Internal Medicine

## 2018-10-29 DIAGNOSIS — R059 Cough, unspecified: Secondary | ICD-10-CM

## 2018-10-29 DIAGNOSIS — R05 Cough: Secondary | ICD-10-CM

## 2018-10-29 NOTE — Telephone Encounter (Signed)
Called pt on number listed. Pt answered but was not able to hear me.   Can inform that rx for amoxicillin has been sent to [facility].

## 2018-10-29 NOTE — Telephone Encounter (Signed)
Pt can go to testing center without an order.

## 2018-10-29 NOTE — Telephone Encounter (Signed)
Patient's wife is scheduled for surgery on September 9th and will be having a COVID test prior to the surgery. She would like for her husband to be tested also.  Can orders be put in for this?

## 2018-10-29 NOTE — Telephone Encounter (Signed)
After speaking to Stef, order is needed and has been put in. Left message informing patient's wife.

## 2018-11-04 ENCOUNTER — Other Ambulatory Visit: Payer: Self-pay

## 2018-11-04 ENCOUNTER — Encounter: Payer: Self-pay | Admitting: Orthopaedic Surgery

## 2018-11-04 ENCOUNTER — Ambulatory Visit: Payer: Self-pay

## 2018-11-04 ENCOUNTER — Ambulatory Visit (INDEPENDENT_AMBULATORY_CARE_PROVIDER_SITE_OTHER): Payer: Medicare Other | Admitting: Orthopaedic Surgery

## 2018-11-04 VITALS — BP 128/63 | HR 81 | Ht 73.0 in | Wt 211.0 lb

## 2018-11-04 DIAGNOSIS — G8929 Other chronic pain: Secondary | ICD-10-CM

## 2018-11-04 DIAGNOSIS — M25561 Pain in right knee: Secondary | ICD-10-CM | POA: Diagnosis not present

## 2018-11-04 DIAGNOSIS — M25562 Pain in left knee: Secondary | ICD-10-CM

## 2018-11-04 DIAGNOSIS — M170 Bilateral primary osteoarthritis of knee: Secondary | ICD-10-CM | POA: Diagnosis not present

## 2018-11-04 MED ORDER — LIDOCAINE HCL 1 % IJ SOLN
2.0000 mL | INTRAMUSCULAR | Status: AC | PRN
  Administered 2018-11-04: 14:00:00 2 mL

## 2018-11-04 MED ORDER — METHYLPREDNISOLONE ACETATE 40 MG/ML IJ SUSP
80.0000 mg | INTRAMUSCULAR | Status: AC | PRN
  Administered 2018-11-04: 14:00:00 80 mg via INTRA_ARTICULAR

## 2018-11-04 MED ORDER — BUPIVACAINE HCL 0.5 % IJ SOLN
2.0000 mL | INTRAMUSCULAR | Status: AC | PRN
  Administered 2018-11-04: 14:00:00 2 mL via INTRA_ARTICULAR

## 2018-11-04 NOTE — Progress Notes (Signed)
Office Visit Note   Patient: Carlos Hudson           Date of Birth: 03-10-1951           MRN: [id number] Visit Date: 11/04/2018              Requested by: Tresa GarterPlotnikov, Aleksei V, MD [address] [place] [id number] PCP: Tresa GarterPlotnikov, Aleksei V, MD   Assessment & Plan: Visit Diagnoses:  1. Chronic pain of both knees   2. Bilateral primary osteoarthritis of knee     Plan: Moderate osteoarthritis both knees.  Mr. Sharol HarnessSimmons is more symptomatic on the left.  Long discussion regarding his diagnosis and treatment options.  He can try over-the-counter NSAIDs.  Will inject the left knee with cortisone and monitor response  Follow-Up Instructions: Return if symptoms worsen or fail to improve.   Orders:  Orders Placed This Encounter  Procedures   Large Joint Inj: L knee   XR KNEE 3 VIEW LEFT   XR KNEE 3 VIEW RIGHT   No orders of the defined types were placed in this encounter.     Procedures: Large Joint Inj: L knee on 11/04/2018 2:06 PM Indications: pain and diagnostic evaluation Details: 25 G 1.5 in needle, anteromedial approach  Arthrogram: No  Medications: 2 mL lidocaine 1 %; 2 mL bupivacaine 0.5 %; 80 mg methylPREDNISolone acetate 40 MG/ML Procedure, treatment alternatives, risks and benefits explained, specific risks discussed. Consent was given by the patient. Patient was prepped and draped in the usual sterile fashion.       Clinical Data: No additional findings.   Subjective: Chief Complaint  Patient presents with   Left Knee - Pain   Right Knee - Pain  Patient presents today for bilateral knee pain. The left is worse than the right. The left side started hurting about two months ago, and the right has hurt for a couple weeks. The left knee is painful medially, and hurts all the time. Weightbearing makes it worse. His job requires him to walk a lot and climb stairs. His right knee hurts around his patella after he had had an active day. He takes Ibuprofen as  needed.  Presently working at [employer] on his feet during the day performing multiple different activities.  Has had more trouble on the left than the right.  No injury or trauma  HPI  Review of Systems   Objective: Vital Signs: BP 128/63    Pulse 81    Ht 6\' 1"  (1.854 m)    Wt 211 lb (95.7 kg)    BMI 27.84 kg/m   Physical Exam Constitutional:      Appearance: He is well-developed.  Eyes:     Pupils: Pupils are equal, round, and reactive to light.  Pulmonary:     Effort: Pulmonary effort is normal.  Skin:    General: Skin is warm and dry.  Neurological:     Mental Status: He is alert and oriented to person, place, and time.  Psychiatric:        Behavior: Behavior normal.     Ortho Exam left knee without effusion.  There is considerable patellar crepitation and mild pain with compression.  Predominately medial compartment pain diffusely yet mild.  No pain laterally.  Full extension.  Flexed over 105 degrees without instability.  No calf pain.  No popliteal discomfort or mass.  No distal edema.  Straight leg raise negative.  Painless range of motion both hips  Specialty  Comments:  No specialty comments available.  Imaging: Xr Knee 3 View Left  Result Date: 11/04/2018 Films of the left knee were obtained in several projections standing.  There are osteophytes in the both medial lateral compartments as well as the patellofemoral joint.  There is more narrowing medially than laterally associated with subchondral sclerosis particularly of the femur.  No cyst were identified.  There is a little ectopic calcification in the intercondylar notch but not along the menisci.  No acute changes.  There is more lateral patella tilt on the left than there is on the right.  Films are consistent with moderate osteoarthritis  Xr Knee 3 View Right  Result Date: 11/04/2018 Films of the right knee were obtained in 3 projections standing.  Compared to the left knee there is less arthritic change.   There are peripheral osteophytes in both medial lateral compartment more so medially than laterally.  There  are a few subchondral cysts in the medial femoral condyle and mild narrowing.  Slight varus of about 1 to 2 degrees    PMFS History: Patient Active Problem List   Diagnosis Date Noted   Bilateral primary osteoarthritis of knee 11/04/2018   COPD mixed type ([place]) 10/06/2018   Tobacco abuse disorder 10/06/2018   Osteoarthritis 10/06/2018   DOE (dyspnea on exertion) 10/06/2018   Well adult exam 10/06/2018   Weight gain 10/06/2018   Past Medical History:  Diagnosis Date   Asthma    COPD (chronic obstructive pulmonary disease) (HCC)    GERD (gastroesophageal reflux disease)     Family History  Problem Relation Age of Onset   Alcohol abuse Father    COPD Father    Hypertension Father    Stroke Father    Heart attack Father    Early death Brother     History reviewed. No pertinent surgical history. Social History   Occupational History   Not on file  Tobacco Use   Smoking status: Current Every Day Smoker    Packs/day: 0.50    Types: Cigarettes   Smokeless tobacco: Never Used  Substance and Sexual Activity   Alcohol use: No    Frequency: Never   Drug use: No   Sexual activity: Not on file

## 2018-11-18 ENCOUNTER — Other Ambulatory Visit: Payer: Self-pay

## 2018-11-18 ENCOUNTER — Ambulatory Visit (INDEPENDENT_AMBULATORY_CARE_PROVIDER_SITE_OTHER): Payer: Medicare Other | Admitting: Internal Medicine

## 2018-11-18 ENCOUNTER — Encounter: Payer: Self-pay | Admitting: Internal Medicine

## 2018-11-18 DIAGNOSIS — N529 Male erectile dysfunction, unspecified: Secondary | ICD-10-CM | POA: Insufficient documentation

## 2018-11-18 DIAGNOSIS — H9313 Tinnitus, bilateral: Secondary | ICD-10-CM

## 2018-11-18 DIAGNOSIS — H9319 Tinnitus, unspecified ear: Secondary | ICD-10-CM | POA: Insufficient documentation

## 2018-11-18 DIAGNOSIS — H6690 Otitis media, unspecified, unspecified ear: Secondary | ICD-10-CM

## 2018-11-18 MED ORDER — CEFDINIR 300 MG PO CAPS: 300.0000 mg | ORAL_CAPSULE | Freq: Two times a day (BID) | ORAL | 0 refills | Status: DC

## 2018-11-18 MED ORDER — SILDENAFIL CITRATE 100 MG PO TABS: 50.0000 mg | ORAL_TABLET | Freq: Every day | ORAL | 5 refills | Status: DC | PRN

## 2018-11-18 NOTE — Progress Notes (Signed)
Subjective:  Patient ID: Carlos Hudson, Carlos    DOB: October 13, 1950  Age: 68 y.o. MRN: [id number]  CC: Tinnitus (left > right)   HPI Carlos Hudson presents for ringing in the ears L>R There is some hearing loss C/o ED  Outpatient Medications Prior to Visit  Medication Sig Dispense Refill  . albuterol (PROAIR HFA) 108 (90 Base) MCG/ACT inhaler Inhale 2 puffs into the lungs every 4 (four) hours as needed for wheezing or shortness of breath. 54 g 3  . amoxicillin (AMOXIL) 500 MG capsule Take 1 capsule (500 mg total) by mouth 3 (three) times daily. 30 capsule 0  . ibuprofen (ADVIL) 200 MG tablet Take 200 mg by mouth every 6 (six) hours as needed.    . sildenafil (VIAGRA) 100 MG tablet Take 0.5-1 tablets (50-100 mg total) by mouth daily as needed for erectile dysfunction. 12 tablet 5   No facility-administered medications prior to visit.     ROS: Review of Systems  Constitutional: Negative for appetite change, fatigue and unexpected weight change.  HENT: Positive for congestion, ear pain and tinnitus. Negative for nosebleeds, sneezing, sore throat and trouble swallowing.   Eyes: Negative for itching and visual disturbance.  Respiratory: Negative for cough.   Cardiovascular: Negative for chest pain, palpitations and leg swelling.  Gastrointestinal: Negative for abdominal distention, blood in stool, diarrhea and nausea.  Genitourinary: Negative for frequency and hematuria.  Musculoskeletal: Negative for back pain, gait problem, joint swelling and neck pain.  Skin: Negative for rash.  Neurological: Negative for dizziness, tremors, speech difficulty and weakness.  Psychiatric/Behavioral: Negative for agitation, dysphoric mood, sleep disturbance and suicidal ideas. The patient is not nervous/anxious.     Objective:  BP 138/80 (BP Location: Left Arm, Patient Position: Sitting, Cuff Size: Normal)   Pulse 75   Temp 97.8 F (36.6 C) (Oral)   Ht 6\' 1"  (1.854 m)   Wt 210 lb (95.3 kg)   SpO2  96%   BMI 27.71 kg/m   BP Readings from Last 3 Encounters:  11/18/18 138/80  11/04/18 128/63  10/06/18 124/68    Wt Readings from Last 3 Encounters:  11/18/18 210 lb (95.3 kg)  11/04/18 211 lb (95.7 kg)  10/06/18 211 lb (95.7 kg)    Physical Exam Constitutional:      General: He is not in acute distress.    Appearance: He is well-developed.     Comments: NAD  HENT:     Right Ear: Tympanic membrane normal.  Eyes:     Conjunctiva/sclera: Conjunctivae normal.     Pupils: Pupils are equal, round, and reactive to light.  Neck:     Musculoskeletal: Normal range of motion.     Thyroid: No thyromegaly.     Vascular: No JVD.  Cardiovascular:     Rate and Rhythm: Normal rate and regular rhythm.     Heart sounds: Normal heart sounds. No murmur. No friction rub. No gallop.   Pulmonary:     Effort: Pulmonary effort is normal. No respiratory distress.     Breath sounds: Normal breath sounds. No wheezing or rales.  Chest:     Chest wall: No tenderness.  Abdominal:     General: Bowel sounds are normal. There is no distension.     Palpations: Abdomen is soft. There is no mass.     Tenderness: There is no abdominal tenderness. There is no guarding or rebound.  Musculoskeletal: Normal range of motion.        General: No tenderness.  Lymphadenopathy:     Cervical: No cervical adenopathy.  Skin:    General: Skin is warm and dry.     Findings: No rash.  Neurological:     Mental Status: He is alert and oriented to person, place, and time.     Cranial Nerves: No cranial nerve deficit.     Motor: No abnormal muscle tone.     Coordination: Coordination normal.     Gait: Gait normal.     Deep Tendon Reflexes: Reflexes are normal and symmetric.  Psychiatric:        Behavior: Behavior normal.        Thought Content: Thought content normal.        Judgment: Judgment normal.   L TM red  Lab Results  Component Value Date   WBC 8.0 10/06/2018   HGB 15.8 10/06/2018   HCT 47.7  10/06/2018   PLT 302.0 10/06/2018   GLUCOSE 120 (H) 10/06/2018   CHOL 165 10/06/2018   TRIG 110.0 10/06/2018   HDL 33.40 (L) 10/06/2018   LDLCALC 109 (H) 10/06/2018   ALT 25 10/06/2018   AST 22 10/06/2018   NA 138 10/06/2018   K 5.0 10/06/2018   CL 102 10/06/2018   CREATININE 1.08 10/06/2018   BUN 15 10/06/2018   CO2 29 10/06/2018   TSH 3.09 10/06/2018   PSA 2.53 10/06/2018    Dg Chest 2 View  Result Date: 05/10/2018 CLINICAL DATA:  Chest pain and cough 1 month. EXAM: CHEST - 2 VIEW COMPARISON:  04/28/2017 FINDINGS: Lungs are adequately inflated with airspace consolidation seen best anteriorly on the lateral film in the region of the right middle lobe or lingula. No effusion. Cardiomediastinal silhouette and remainder of the exam is unchanged. IMPRESSION: Airspace process over the anterior mid lungs on the lateral film likely either involving the right middle lobe or lingula and likely due to infection/pneumonia. Electronically Signed   By: Elberta Fortisaniel  Boyle M.D.   On: 05/10/2018 10:01    Assessment & Plan:   There are no diagnoses linked to this encounter.   Meds ordered this encounter  Medications  . sildenafil (VIAGRA) 100 MG tablet    Sig: Take 0.5-1 tablets (50-100 mg total) by mouth daily as needed for erectile dysfunction.    Dispense:  12 tablet    Refill:  5     Follow-up: No follow-ups on file.  Sonda PrimesAlex Luane Rochon, MD

## 2018-11-18 NOTE — Assessment & Plan Note (Signed)
PO Cefdinir

## 2018-11-18 NOTE — Assessment & Plan Note (Signed)
Viagra prn 

## 2018-11-18 NOTE — Assessment & Plan Note (Signed)
Treat OM ENT ref if not resolved

## 2019-01-06 ENCOUNTER — Ambulatory Visit: Payer: Medicare Other | Admitting: Internal Medicine

## 2019-01-25 ENCOUNTER — Ambulatory Visit: Payer: Medicare Other | Admitting: Internal Medicine

## 2019-02-03 ENCOUNTER — Ambulatory Visit: Payer: Medicare Other | Admitting: Internal Medicine

## 2019-04-26 ENCOUNTER — Encounter: Payer: Self-pay | Admitting: Internal Medicine

## 2019-04-26 ENCOUNTER — Ambulatory Visit (INDEPENDENT_AMBULATORY_CARE_PROVIDER_SITE_OTHER): Payer: Medicare Other | Admitting: Internal Medicine

## 2019-04-26 DIAGNOSIS — N529 Male erectile dysfunction, unspecified: Secondary | ICD-10-CM | POA: Diagnosis not present

## 2019-04-26 DIAGNOSIS — H9313 Tinnitus, bilateral: Secondary | ICD-10-CM

## 2019-04-26 DIAGNOSIS — H6690 Otitis media, unspecified, unspecified ear: Secondary | ICD-10-CM | POA: Diagnosis not present

## 2019-04-26 MED ORDER — AMOXICILLIN 500 MG PO CAPS: 500.0000 mg | ORAL_CAPSULE | Freq: Three times a day (TID) | ORAL | 0 refills | Status: DC

## 2019-04-26 MED ORDER — TADALAFIL 20 MG PO TABS: 20.0000 mg | ORAL_TABLET | ORAL | 5 refills | Status: DC | PRN

## 2019-04-26 NOTE — Assessment & Plan Note (Signed)
ENT ref offered 

## 2019-04-26 NOTE — Assessment & Plan Note (Signed)
Viagra prn - d/c - ineffective Cialis prn

## 2019-04-26 NOTE — Progress Notes (Signed)
Virtual Visit via Telephone Note  I connected with Jesse Fall on 04/26/19 at  9:30 AM EST by telephone and verified that I am speaking with the correct person using two identifiers.   I discussed the limitations, risks, security and privacy concerns of performing an evaluation and management service by telephone and the availability of in person appointments. I also discussed with the patient that there may be a patient responsible charge related to this service. The patient expressed understanding and agreed to proceed.   History of Present Illness:   C/o ringing in the ear, ear pain C/o ED. Viagra is not working  Observations/Objective: Sounds normal on the phone  Assessment and Plan:  See Plan Follow Up Instructions:    I discussed the assessment and treatment plan with the patient. The patient was provided an opportunity to ask questions and all were answered. The patient agreed with the plan and demonstrated an understanding of the instructions.   The patient was advised to call back or seek an in-person evaluation if the symptoms worsen or if the condition fails to improve as anticipated.  I provided 11 minutes of non-face-to-face time during this encounter.   Sonda Primes, MD

## 2019-04-26 NOTE — Assessment & Plan Note (Signed)
Amoxicillin x10d 

## 2019-06-02 ENCOUNTER — Ambulatory Visit: Payer: Medicare Other | Admitting: Internal Medicine

## 2019-06-29 ENCOUNTER — Ambulatory Visit: Payer: Medicare Other | Admitting: Internal Medicine

## 2019-07-13 ENCOUNTER — Ambulatory Visit (INDEPENDENT_AMBULATORY_CARE_PROVIDER_SITE_OTHER): Payer: Medicare Other | Admitting: Internal Medicine

## 2019-07-13 ENCOUNTER — Other Ambulatory Visit: Payer: Self-pay

## 2019-07-13 ENCOUNTER — Encounter: Payer: Self-pay | Admitting: Internal Medicine

## 2019-07-13 VITALS — BP 134/66 | HR 79 | Temp 98.2°F | Ht 73.0 in | Wt 222.0 lb

## 2019-07-13 DIAGNOSIS — N529 Male erectile dysfunction, unspecified: Secondary | ICD-10-CM | POA: Diagnosis not present

## 2019-07-13 DIAGNOSIS — Z0000 Encounter for general adult medical examination without abnormal findings: Secondary | ICD-10-CM

## 2019-07-13 DIAGNOSIS — Z720 Tobacco use: Secondary | ICD-10-CM | POA: Diagnosis not present

## 2019-07-13 DIAGNOSIS — J449 Chronic obstructive pulmonary disease, unspecified: Secondary | ICD-10-CM | POA: Diagnosis not present

## 2019-07-13 MED ORDER — VITAMIN D3 50 MCG (2000 UT) PO CAPS: 2000.0000 [IU] | ORAL_CAPSULE | Freq: Every day | ORAL | 3 refills | Status: DC

## 2019-07-13 MED ORDER — ALPROSTADIL (VASODILATOR) 10 MCG IC KIT: 10.0000 ug | PACK | INTRACAVERNOUS | 5 refills | Status: DC | PRN

## 2019-07-13 NOTE — Progress Notes (Signed)
Subjective:  Patient ID: Carlos Hudson, male    DOB: 1950-04-04  Age: 69 y.o. MRN: [id number]  CC: No chief complaint on file.   HPI Param Capri presents for ED - meds not working, COPD, smoking   Outpatient Medications Prior to Visit  Medication Sig Dispense Refill  . albuterol (PROAIR HFA) 108 (90 Base) MCG/ACT inhaler Inhale 2 puffs into the lungs every 4 (four) hours as needed for wheezing or shortness of breath. 54 g 3  . ibuprofen (ADVIL) 200 MG tablet Take 200 mg by mouth every 6 (six) hours as needed.    . sildenafil (VIAGRA) 100 MG tablet Take 100 mg by mouth daily as needed.    . tadalafil (CIALIS) 20 MG tablet Take 1 tablet (20 mg total) by mouth every three (3) days as needed for erectile dysfunction. 10 tablet 5  . amoxicillin (AMOXIL) 500 MG capsule Take 1 capsule (500 mg total) by mouth 3 (three) times daily. (Patient not taking: Reported on 07/13/2019) 30 capsule 0  . cefdinir (OMNICEF) 300 MG capsule Take 1 capsule (300 mg total) by mouth 2 (two) times daily. 20 capsule 0   No facility-administered medications prior to visit.    ROS: Review of Systems  Constitutional: Negative for appetite change, fatigue and unexpected weight change.  HENT: Negative for congestion, nosebleeds, sneezing, sore throat and trouble swallowing.   Eyes: Negative for itching and visual disturbance.  Respiratory: Positive for cough.   Cardiovascular: Negative for chest pain, palpitations and leg swelling.  Gastrointestinal: Negative for abdominal distention, blood in stool, diarrhea and nausea.  Genitourinary: Negative for frequency and hematuria.  Musculoskeletal: Negative for back pain, gait problem, joint swelling and neck pain.  Skin: Negative for rash.  Neurological: Negative for dizziness, tremors, speech difficulty and weakness.  Psychiatric/Behavioral: Negative for agitation, dysphoric mood and sleep disturbance. The patient is not nervous/anxious.     Objective:  BP 134/66  (BP Location: Left Arm, Patient Position: Sitting, Cuff Size: Large)   Pulse 79   Temp 98.2 F (36.8 C) (Oral)   Ht 6\' 1"  (1.854 m)   Wt 222 lb (100.7 kg)   SpO2 93%   BMI 29.29 kg/m   BP Readings from Last 3 Encounters:  07/13/19 134/66  11/18/18 138/80  11/04/18 128/63    Wt Readings from Last 3 Encounters:  07/13/19 222 lb (100.7 kg)  11/18/18 210 lb (95.3 kg)  11/04/18 211 lb (95.7 kg)    Physical Exam Constitutional:      General: He is not in acute distress.    Appearance: He is well-developed.     Comments: NAD  Eyes:     Conjunctiva/sclera: Conjunctivae normal.     Pupils: Pupils are equal, round, and reactive to light.  Neck:     Thyroid: No thyromegaly.     Vascular: No JVD.  Cardiovascular:     Rate and Rhythm: Normal rate and regular rhythm.     Heart sounds: Normal heart sounds. No murmur. No friction rub. No gallop.   Pulmonary:     Effort: Pulmonary effort is normal. No respiratory distress.     Breath sounds: Normal breath sounds. No wheezing or rales.  Chest:     Chest wall: No tenderness.  Abdominal:     General: Bowel sounds are normal. There is no distension.     Palpations: Abdomen is soft. There is no mass.     Tenderness: There is no abdominal tenderness. There is no guarding or rebound.  Musculoskeletal:        General: No tenderness. Normal range of motion.     Cervical back: Normal range of motion.  Lymphadenopathy:     Cervical: No cervical adenopathy.  Skin:    General: Skin is warm and dry.     Findings: No rash.  Neurological:     Mental Status: He is alert and oriented to person, place, and time.     Cranial Nerves: No cranial nerve deficit.     Motor: No abnormal muscle tone.     Coordination: Coordination normal.     Gait: Gait normal.     Deep Tendon Reflexes: Reflexes are normal and symmetric.  Psychiatric:        Behavior: Behavior normal.        Thought Content: Thought content normal.        Judgment: Judgment  normal.     Lab Results  Component Value Date   WBC 8.0 10/06/2018   HGB 15.8 10/06/2018   HCT 47.7 10/06/2018   PLT 302.0 10/06/2018   GLUCOSE 120 (H) 10/06/2018   CHOL 165 10/06/2018   TRIG 110.0 10/06/2018   HDL 33.40 (L) 10/06/2018   LDLCALC 109 (H) 10/06/2018   ALT 25 10/06/2018   AST 22 10/06/2018   NA 138 10/06/2018   K 5.0 10/06/2018   CL 102 10/06/2018   CREATININE 1.08 10/06/2018   BUN 15 10/06/2018   CO2 29 10/06/2018   TSH 3.09 10/06/2018   PSA 2.53 10/06/2018    DG Chest 2 View  Result Date: 05/10/2018 CLINICAL DATA:  Chest pain and cough 1 month. EXAM: CHEST - 2 VIEW COMPARISON:  04/28/2017 FINDINGS: Lungs are adequately inflated with airspace consolidation seen best anteriorly on the lateral film in the region of the right middle lobe or lingula. No effusion. Cardiomediastinal silhouette and remainder of the exam is unchanged. IMPRESSION: Airspace process over the anterior mid lungs on the lateral film likely either involving the right middle lobe or lingula and likely due to infection/pneumonia. Electronically Signed   By: Elberta Fortis M.D.   On: 05/10/2018 10:01    Assessment & Plan:    Sonda Primes, MD

## 2019-07-13 NOTE — Assessment & Plan Note (Signed)
Albuterol prn

## 2019-07-13 NOTE — Addendum Note (Signed)
Addended by: Merrilyn Puma on: 07/13/2019 10:45 AM   Modules accepted: Orders

## 2019-07-13 NOTE — Addendum Note (Signed)
Addended by: Rivet, Brithany Whitworth N on: 07/13/2019 10:26 AM   Modules accepted: Orders  

## 2019-07-13 NOTE — Assessment & Plan Note (Addendum)
2 ppd - discussed A cardiac CT scan for coronary calcium offered

## 2019-07-13 NOTE — Assessment & Plan Note (Signed)
Cialis prn - not helping Try Cavreject Urol ref offered

## 2019-07-13 NOTE — Patient Instructions (Addendum)
Alprostadil intracavernosal injection What is this medicine? ALPROSTADIL (al PROS ta dil) is used to treat erectile dysfunction (ED). This medicine helps to create and maintain an erection. This medicine may be used for other purposes; ask your health care provider or pharmacist if you have questions. COMMON BRAND NAME(S): Caverject, Caverject Impulse, Edex What should I tell my health care provider before I take this medicine? They need to know if you have any of these conditions:  an abnormally formed penis  have been advised not to engage in sexual activity  have ever had a painful erection that lasted more than 4 hours  heart problems  leukemia  low blood pressure  penile implant  sickle cell disease or trait  tumor of the bone marrow (multiple myeloma)  an unusual or allergic reaction to alprostadil or other medicines, foods, dyes, or preservatives How should I use this medicine? This medicine is for injection into the penis. You will be taught how to use this medicine. Use exactly as directed. Do not take your medicine more often than directed. It is important that you put your used needles and syringes in a special sharps container. Do not put them in a trash can. If you do not have a sharps container, call your pharmacist or healthcare provider to get one. This medicine is for use in men only and is not for use in children. Overdosage: If you think you have taken too much of this medicine contact a poison control center or emergency room at once. NOTE: This medicine is only for you. Do not share this medicine with others. What if I miss a dose? You should not use this medicine more than 3 times a week. Each dose should be given at least 24 hours apart. What may interact with this medicine?  medicines for blood pressure This list may not describe all possible interactions. Give your health care provider a list of all the medicines, herbs, non-prescription drugs, or  dietary supplements you use. Also tell them if you smoke, drink alcohol, or use illegal drugs. Some items may interact with your medicine. What should I watch for while using this medicine? Contact your doctor or health care professional right away if you have an erection that lasts longer than 4 hours or if it becomes painful. This may be a sign of a serious problem and must be treated right away to prevent permanent damage. Do not change the dose of your medication. Call your doctor or health care professional to determine if your dose needs to be changed. This medicine does not protect you or your partner against HIV infection (the virus that causes AIDS) or other sexually transmitted diseases. What side effects may I notice from receiving this medicine? Side effects that you should report to your doctor or health care professional as soon as possible:  allergic reactions like skin rash, itching or hives, swelling of the face, lips, or tongue  prolonged or painful erection Side effects that usually do not require medical attention (report to your doctor or health care professional if they continue or are bothersome):  bleeding, bruising, or pain at site of injection  change in blood pressure This list may not describe all possible side effects. Call your doctor for medical advice about side effects. You may report side effects to FDA at 1-800-FDA-1088. Where should I keep my medicine? Keep out of reach of children. Caverject: Store unopened product at or below 25 degrees C (77 degrees F). Do not freeze.  See product instructions for storage when product is in use. Different products may have different instructions for storage. Throw away any unused medicine after the expiration date. Edex: Store unopened product at room temperature between 15 and 30 degrees C (59 and 86 degrees F). Do not freeze. See product instructions for storage when product is in use. Throw away any unused medicine after  the expiration date. When traveling, do not store this medicine in checked luggage during air travel or leave in a closed automobile. NOTE: This sheet is a summary. It may not cover all possible information. If you have questions about this medicine, talk to your doctor, pharmacist, or health care provider.  2020 Elsevier/Gold Standard (2014-08-09 13:19:52)   Cardiac CT calcium scoring test $150 Tel # is [phone]   Computed tomography, more commonly known as a CT or CAT scan, is a diagnostic medical imaging test. Like traditional x-rays, it produces multiple images or pictures of the inside of the body. The cross-sectional images generated during a CT scan can be reformatted in multiple planes. They can even generate three-dimensional images. These images can be viewed on a computer monitor, printed on film or by a 3D printer, or transferred to a CD or DVD. CT images of internal organs, bones, soft tissue and blood vessels provide greater detail than traditional x-rays, particularly of soft tissues and blood vessels. A cardiac CT scan for coronary calcium is a non-invasive way of obtaining information about the presence, location and extent of calcified plaque in the coronary arteries-the vessels that supply oxygen-containing blood to the heart muscle. Calcified plaque results when there is a build-up of fat and other substances under the inner layer of the artery. This material can calcify which signals the presence of atherosclerosis, a disease of the vessel wall, also called coronary artery disease (CAD). People with this disease have an increased risk for heart attacks. In addition, over time, progression of plaque build up (CAD) can narrow the arteries or even close off blood flow to the heart. The result may be chest pain, sometimes called "angina," or a heart attack. Because calcium is a marker of CAD, the amount of calcium detected on a cardiac CT scan is a helpful prognostic tool. The  findings on cardiac CT are expressed as a calcium score. Another name for this test is coronary artery calcium scoring.  What are some common uses of the procedure? The goal of cardiac CT scan for calcium scoring is to determine if CAD is present and to what extent, even if there are no symptoms. It is a screening study that may be recommended by a physician for patients with risk factors for CAD but no clinical symptoms. The major risk factors for CAD are: . high blood cholesterol levels  . family history of heart attacks  . diabetes  . high blood pressure  . cigarette smoking  . overweight or obese  . physical inactivity   A negative cardiac CT scan for calcium scoring shows no calcification within the coronary arteries. This suggests that CAD is absent or so minimal it cannot be seen by this technique. The chance of having a heart attack over the next two to five years is very low under these circumstances. A positive test means that CAD is present, regardless of whether or not the patient is experiencing any symptoms. The amount of calcification-expressed as the calcium score-may help to predict the likelihood of a myocardial infarction (heart attack) in the coming years and helps  your medical doctor or cardiologist decide whether the patient may need to take preventive medicine or undertake other measures such as diet and exercise to lower the risk for heart attack. The extent of CAD is graded according to your calcium score:  Calcium Score  Presence of CAD (coronary artery disease)  0 No evidence of CAD   1-10 Minimal evidence of CAD  11-100 Mild evidence of CAD  101-400 Moderate evidence of CAD  Over 400 Extensive evidence of CAD

## 2019-07-14 ENCOUNTER — Telehealth: Payer: Self-pay | Admitting: Internal Medicine

## 2019-07-14 NOTE — Telephone Encounter (Signed)
° ° °  Pt c/o medication issue:  1. Name of Medication: alprostadil (EDEX) 10 MCG injection  2. How are you currently taking this medication (dosage and times per day)? n/a  3. Are you having a reaction (difficulty breathing--STAT)? n/a  4. What is your medication issue?  Patient states medication too costly ($500)

## 2019-07-15 NOTE — Telephone Encounter (Signed)
Please advise about alternative 

## 2019-07-18 NOTE — Telephone Encounter (Signed)
Is it not covered? What is the alternative? Thx

## 2019-07-19 NOTE — Telephone Encounter (Signed)
LMTCB

## 2019-07-27 ENCOUNTER — Telehealth: Payer: Self-pay | Admitting: Internal Medicine

## 2019-07-27 NOTE — Telephone Encounter (Signed)
New message:   Pt is calling and states that this medication is $500 out of pocket. Pt states they can't remember the name of this new medication.

## 2019-08-03 ENCOUNTER — Telehealth: Payer: Self-pay

## 2019-08-03 ENCOUNTER — Other Ambulatory Visit: Payer: Self-pay

## 2019-08-03 MED ORDER — SILDENAFIL CITRATE 100 MG PO TABS: 100.0000 mg | ORAL_TABLET | Freq: Every day | ORAL | 2 refills | Status: DC | PRN

## 2019-08-03 NOTE — Telephone Encounter (Signed)
New message    Seen on  4.21.21   1.Medication Requested:alprostadil (EDEX) 10 MCG injection  2. Pharmacy (Name, Street, [place]):[id number] - [place] ([place]), [place] - [address]  3. On Med List: Yes   4. Last Visit with PCP: 4.21.21   5. Next visit date with PCP: 10.21.21    Agent: Please be advised that RX refills may take up to 3 business days. We ask that you follow-up with your pharmacy.

## 2019-08-03 NOTE — Telephone Encounter (Signed)
See other TE, pt done not want this medicaiton.

## 2019-08-03 NOTE — Telephone Encounter (Signed)
Pt would like a refill on this medication due to the Edex injection being to high. He would also like to know if he can get more than 12 at a time since he is taking it daily.

## 2019-08-03 NOTE — Telephone Encounter (Signed)
    Spouse calling to request prior auth be done for sildenafil (VIAGRA) 100 MG tablet, 30 day supply [facility] [phone]

## 2019-08-04 NOTE — Telephone Encounter (Signed)
Key: [id number]

## 2019-08-08 NOTE — Telephone Encounter (Signed)
PA denied, Sildenafil 100mg  tablet (use as directed: 1 tablet per day) is denied as a benefit exclusion. Your plan only allows you to receive 4 tablets per 30 days.

## 2019-08-18 ENCOUNTER — Telehealth: Payer: Self-pay

## 2019-08-18 NOTE — Telephone Encounter (Signed)
1.Medication Requested:  2. Pharmacy (Name, Street, City):  3. On Med List:   4. Last Visit with PCP:   5. Next visit date with PCP:   Agent: Please be advised that RX refills may take up to 3 business days. We ask that you follow-up with your pharmacy.  

## 2019-08-25 ENCOUNTER — Encounter: Payer: Self-pay | Admitting: Internal Medicine

## 2019-08-25 ENCOUNTER — Ambulatory Visit (INDEPENDENT_AMBULATORY_CARE_PROVIDER_SITE_OTHER): Payer: Medicare Other | Admitting: Internal Medicine

## 2019-08-25 ENCOUNTER — Other Ambulatory Visit: Payer: Self-pay

## 2019-08-25 DIAGNOSIS — H9313 Tinnitus, bilateral: Secondary | ICD-10-CM | POA: Diagnosis not present

## 2019-08-25 DIAGNOSIS — M25562 Pain in left knee: Secondary | ICD-10-CM

## 2019-08-25 DIAGNOSIS — N529 Male erectile dysfunction, unspecified: Secondary | ICD-10-CM | POA: Diagnosis not present

## 2019-08-25 DIAGNOSIS — G8929 Other chronic pain: Secondary | ICD-10-CM | POA: Diagnosis not present

## 2019-08-25 DIAGNOSIS — Z0000 Encounter for general adult medical examination without abnormal findings: Secondary | ICD-10-CM

## 2019-08-25 LAB — CBC WITH DIFFERENTIAL/PLATELET
Basophils Absolute: 0 10*3/uL (ref 0.0–0.1)
Basophils Relative: 0.7 % (ref 0.0–3.0)
Eosinophils Absolute: 0.5 10*3/uL (ref 0.0–0.7)
Eosinophils Relative: 6.7 % — ABNORMAL HIGH (ref 0.0–5.0)
HCT: 43.2 % (ref 39.0–52.0)
Hemoglobin: 15.1 g/dL (ref 13.0–17.0)
Lymphocytes Relative: 33.2 % (ref 12.0–46.0)
Lymphs Abs: 2.5 10*3/uL (ref 0.7–4.0)
MCHC: 34.9 g/dL (ref 30.0–36.0)
MCV: 90.4 fl (ref 78.0–100.0)
Monocytes Absolute: 0.7 10*3/uL (ref 0.1–1.0)
Monocytes Relative: 9.1 % (ref 3.0–12.0)
Neutro Abs: 3.8 10*3/uL (ref 1.4–7.7)
Neutrophils Relative %: 50.3 % (ref 43.0–77.0)
Platelets: 281 10*3/uL (ref 150.0–400.0)
RBC: 4.78 Mil/uL (ref 4.22–5.81)
RDW: 13.8 % (ref 11.5–15.5)
WBC: 7.6 10*3/uL (ref 4.0–10.5)

## 2019-08-25 LAB — BASIC METABOLIC PANEL
BUN: 17 mg/dL (ref 6–23)
CO2: 30 mEq/L (ref 19–32)
Calcium: 9.7 mg/dL (ref 8.4–10.5)
Chloride: 100 mEq/L (ref 96–112)
Creatinine, Ser: 1.08 mg/dL (ref 0.40–1.50)
GFR: 67.74 mL/min (ref >60.00)
Glucose, Bld: 127 mg/dL — ABNORMAL HIGH (ref 70–99)
Potassium: 4.4 mEq/L (ref 3.5–5.1)
Sodium: 138 mEq/L (ref 135–145)

## 2019-08-25 LAB — HEPATIC FUNCTION PANEL
ALT: 24 U/L (ref 0–53)
AST: 19 U/L (ref 0–37)
Albumin: 4.4 g/dL (ref 3.5–5.2)
Alkaline Phosphatase: 64 U/L (ref 39–117)
Bilirubin, Direct: 0.1 mg/dL (ref 0.0–0.3)
Total Bilirubin: 0.4 mg/dL (ref 0.2–1.2)
Total Protein: 6.9 g/dL (ref 6.0–8.3)

## 2019-08-25 LAB — LIPID PANEL
Cholesterol: 164 mg/dL (ref 0–200)
HDL: 29.7 mg/dL — ABNORMAL LOW (ref >39.00)
NonHDL: 134.23
Total CHOL/HDL Ratio: 6
Triglycerides: 209 mg/dL — ABNORMAL HIGH (ref 0.0–149.0)
VLDL: 41.8 mg/dL — ABNORMAL HIGH (ref 0.0–40.0)

## 2019-08-25 LAB — LDL CHOLESTEROL, DIRECT: Direct LDL: 86 mg/dL

## 2019-08-25 LAB — TSH: TSH: 3.79 u[IU]/mL (ref 0.35–4.50)

## 2019-08-25 LAB — PSA: PSA: 1.71 ng/mL (ref 0.10–4.00)

## 2019-08-25 MED ORDER — MELOXICAM 15 MG PO TABS: 15.0000 mg | ORAL_TABLET | Freq: Every day | ORAL | 1 refills | Status: DC | PRN

## 2019-08-25 MED ORDER — ALPROSTADIL (VASODILATOR) 10 MCG IC KIT: 10.0000 ug | PACK | INTRACAVERNOUS | 5 refills | Status: DC | PRN

## 2019-08-25 NOTE — Assessment & Plan Note (Signed)
Cavreject Rx

## 2019-08-25 NOTE — Progress Notes (Signed)
   Subjective:  Patient ID: Carlos Hudson, male    DOB: [birth year]  Age: 69 y.o. MRN: [id number]  CC: Knee Pain (Left knee pain) and Tinnitus (Left ear)   HPI Sammy Cassar presents for L knee chronic pain C/o L ear roaring sound C/o ED  Outpatient Medications Prior to Visit  Medication Sig Dispense Refill  . albuterol (PROAIR HFA) 108 (90 Base) MCG/ACT inhaler Inhale 2 puffs into the lungs every 4 (four) hours as needed for wheezing or shortness of breath. 54 g 3  . alprostadil (EDEX) 10 MCG injection 10 mcg by Intracavitary route as needed for erectile dysfunction. use no more than 3 times per week 3 each 5  . Cholecalciferol (VITAMIN D3) 50 MCG (2000 UT) capsule Take 1 capsule (2,000 Units total) by mouth daily. 100 capsule 3  . ibuprofen (ADVIL) 200 MG tablet Take 200 mg by mouth every 6 (six) hours as needed.    . sildenafil (VIAGRA) 100 MG tablet Take 1 tablet (100 mg total) by mouth daily as needed for erectile dysfunction. 12 tablet 2  . tadalafil (CIALIS) 20 MG tablet Take 1 tablet (20 mg total) by mouth every three (3) days as needed for erectile dysfunction. 10 tablet 5   No facility-administered medications prior to visit.    ROS: Review of Systems  Objective:  BP 130/80 (BP Location: Left Arm, Patient Position: Sitting, Cuff Size: Large)   Pulse 76   Temp 98.2 F (36.8 C) (Oral)   Ht 6\' 1"  (1.854 m)   Wt 220 lb 9.6 oz (100.1 kg)   SpO2 95%   BMI 29.10 kg/m   BP Readings from Last 3 Encounters:  08/25/19 130/80  07/13/19 134/66  11/18/18 138/80    Wt Readings from Last 3 Encounters:  08/25/19 220 lb 9.6 oz (100.1 kg)  07/13/19 222 lb (100.7 kg)  11/18/18 210 lb (95.3 kg)    Physical Exam  Lab Results  Component Value Date   WBC 8.0 10/06/2018   HGB 15.8 10/06/2018   HCT 47.7 10/06/2018   PLT 302.0 10/06/2018   GLUCOSE 120 (H) 10/06/2018   CHOL 165 10/06/2018   TRIG 110.0 10/06/2018   HDL 33.40 (L) 10/06/2018   LDLCALC 109 (H) 10/06/2018   ALT  25 10/06/2018   AST 22 10/06/2018   NA 138 10/06/2018   K 5.0 10/06/2018   CL 102 10/06/2018   CREATININE 1.08 10/06/2018   BUN 15 10/06/2018   CO2 29 10/06/2018   TSH 3.09 10/06/2018   PSA 2.53 10/06/2018    DG Chest 2 View  Result Date: 05/10/2018 CLINICAL DATA:  Chest pain and cough 1 month. EXAM: CHEST - 2 VIEW COMPARISON:  04/28/2017 FINDINGS: Lungs are adequately inflated with airspace consolidation seen best anteriorly on the lateral film in the region of the right middle lobe or lingula. No effusion. Cardiomediastinal silhouette and remainder of the exam is unchanged. IMPRESSION: Airspace process over the anterior mid lungs on the lateral film likely either involving the right middle lobe or lingula and likely due to infection/pneumonia. Electronically Signed   By: 06/26/2017 M.D.   On: 05/10/2018 10:01    Assessment & Plan:   There are no diagnoses linked to this encounter.   No orders of the defined types were placed in this encounter.    Follow-up: No follow-ups on file.  05/12/2018, MD

## 2019-08-25 NOTE — Addendum Note (Signed)
Addended by: Miguel Aschoff on: 08/25/2019 08:15 AM   Modules accepted: Orders

## 2019-08-25 NOTE — Patient Instructions (Signed)
Lactose Intolerance, Adult Lactose is a natural sugar that is found in dairy milk and dairy products such as cheese and yogurt. Lactose is digested by lactase, a protein (enzyme) in your small intestine. Some people do not produce enough lactase to digest lactose. This is called lactose intolerance. Lactose intolerance is different from milk allergy, which is a more serious reaction to the protein in milk. What are the causes? Causes of lactose intolerance may include:  Normal aging. The ability to produce lactase may lessen with age, causing lactose intolerance over time.  Being born without the ability to make lactase.  Digestive diseases such as gastroenteritis or inflammatory bowel disease (IBD).  Surgery or injury to your small intestine.  Infection in your intestines.  Certain antibiotic medicines and cancer treatments. What are the signs or symptoms? Lactose intolerance can cause discomfort within 30 minutes to 2 hours after you eat or drink something that contains lactose. Symptoms may include:  Nausea.  Diarrhea.  Cramps or pain in the abdomen.  A full, tight, or painful feeling in the abdomen (bloating).  Gas. How is this diagnosed? This condition may be diagnosed based on:  Your symptoms and medical history.  Lactose tolerance test. This test involves drinking a lactose solution and then having blood tests to measure the amount of glucose in your blood. If your blood glucose level does not go up, it means your body is not able to digest the lactose.  Lactose breath test (hydrogen breath test). This test involves drinking a lactose solution and then exhaling into a type of bag while you digest the solution. Having a lot of hydrogen in your breath can be a sign of lactose intolerance.  Stool acidity test. This involves drinking a lactose solution and then having your stool samples tested for bacteria. Having a lot of bacteria causes stool to be considered acidic, which  is a sign of lactose intolerance. How is this treated? There is no treatment to improve your body's ability to produce lactase. However, you can manage your symptoms at home by:  Limiting or avoiding dairy milk, dairy products, and other sources of lactose.  Taking lactase tablets when you eat milk products. Lactase tablets are over-the-counter medicines that help to improve lactose digestion. You may also add lactase drops to regular milk.  Adjusting your diet, such as drinking lactose-free milk. Lactose tolerance varies from person to person. Some people may be able to eat or drink small amounts of products that contain lactose, and other people may need to avoid all foods and drinks that contain lactose. Talk with your health care provider about what treatment is best for you. Follow these instructions at home:  Limit or avoid foods, beverages, and medicines that contain lactose, as told by your health care provider. Keep track of which foods, beverages, or medicines cause symptoms so you can decide what to avoid in the future.  Read food and medicine labels carefully. Avoid products that contain: ? Lactose. ? Milk solids. ? Casein. ? Whey.  Take over-the-counter and prescription medicines (including lactase tablets) only as told by your health care provider.  If you stop eating and drinking dairy products (eliminate dairy from your diet), make sure to get enough protein, calcium, and vitamin D from other foods. Work with your health care provider or a diet and nutrition specialist (dietitian) to make sure you get enough of those nutrients.  Choose a milk substitute that is fortified with calcium and vitamin D. ? Soy milk   contains high-quality protein. ? Milks that are made from nuts or grains (such as almond milk and rice milk) contain very small amounts of protein.  Keep all follow-up visits as told by your health care provider. This is important. Contact a health care provider  if:  You have no relief from your symptoms after you have eliminated milk products and other sources of lactose. Get help right away if:  You have blood in your stool.  You have severe abdomen (abdominal) pain. Summary  Lactose is a natural sugar that is found in dairy milk and dairy products such as cheese and yogurt. Lactose is digested by lactase, which is a protein (enzyme) in the small intestine.  Some people do not produce enough lactase to digest lactose. This is called lactose intolerance.  Lactose intolerance can cause discomfort within 30 minutes to 2 hours after you eat or drink something that contains lactose.  Limit or avoid foods, beverages, and medicines that contain lactose, as told by your health care provider. This information is not intended to replace advice given to you by your health care provider. Make sure you discuss any questions you have with your health care provider. Document Revised: 02/20/2017 Document Reviewed: 10/24/2016 Elsevier Patient Education  2020 [employer]  

## 2019-08-25 NOTE — Assessment & Plan Note (Addendum)
L>R  ENT ref 

## 2019-08-25 NOTE — Assessment & Plan Note (Signed)
F/u w/Dr Cleophas Dunker He had steroid injections - declined one now Meloxicam Brace Vit d

## 2019-09-07 ENCOUNTER — Encounter: Payer: Self-pay | Admitting: *Deleted

## 2019-09-17 IMAGING — CT CT ABD-PELV W/O CM
2 of 4 series · 16 of 46 positions shown, 18 images · non-contrast
Comparison: None.

CLINICAL DATA: Abdominal pain.

EXAM:
CT ABDOMEN AND PELVIS WITHOUT CONTRAST
TECHNIQUE: Multidetector CT imaging of the abdomen and pelvis was performed
following the standard protocol without IV contrast.

[Series 3: a/p w/o 5mm · axial · non-contrast · 0.85mm/px · z∈[+936,+1371]mm · 13 of 95 slices shown, 15 images]
[im 4/95  soft-tissue]
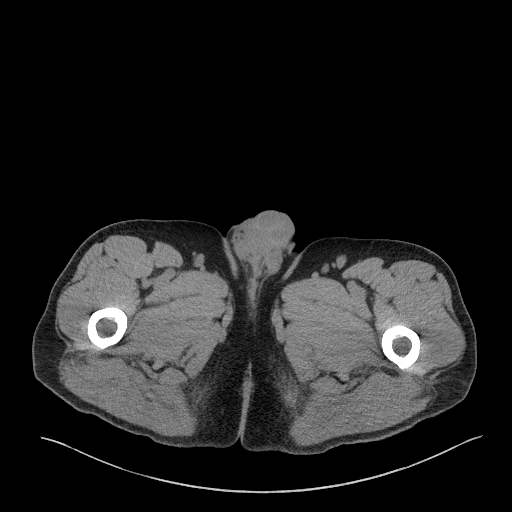
[im 4/95  bone]
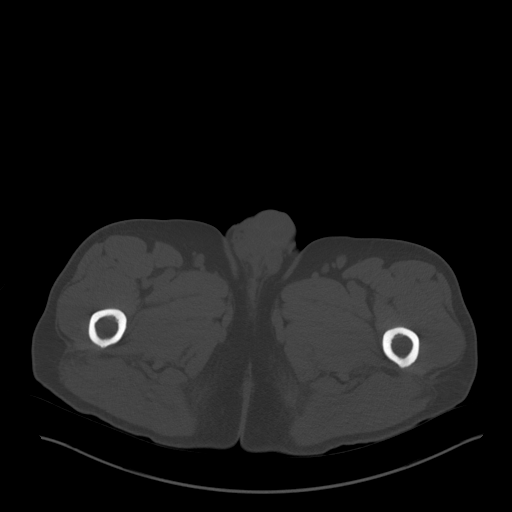
[im 11/95  soft-tissue]
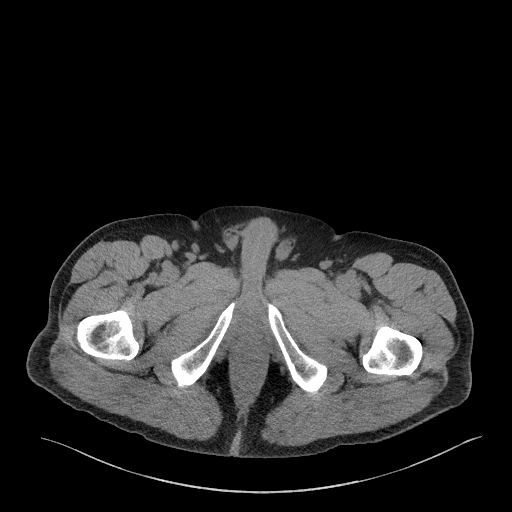
[im 19/95  soft-tissue]
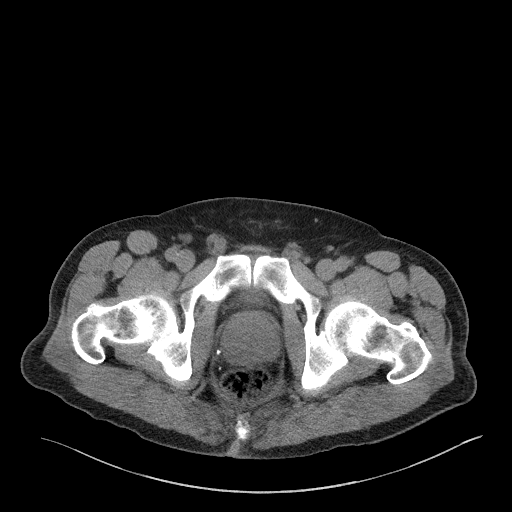
[im 26/95  soft-tissue]
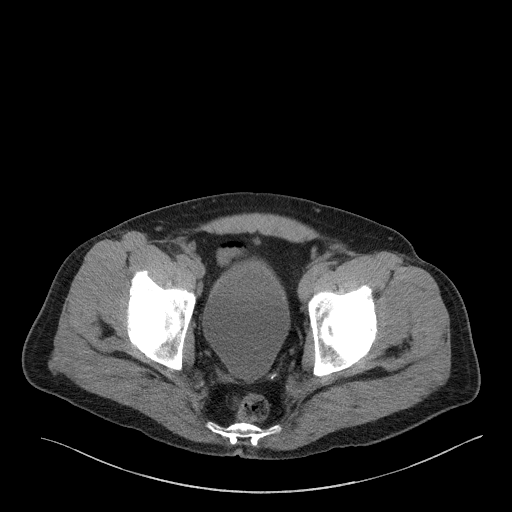
[im 33/95  soft-tissue]
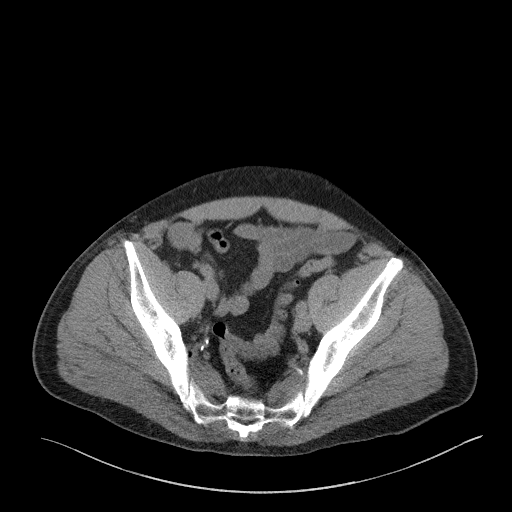
[im 40/95  soft-tissue]
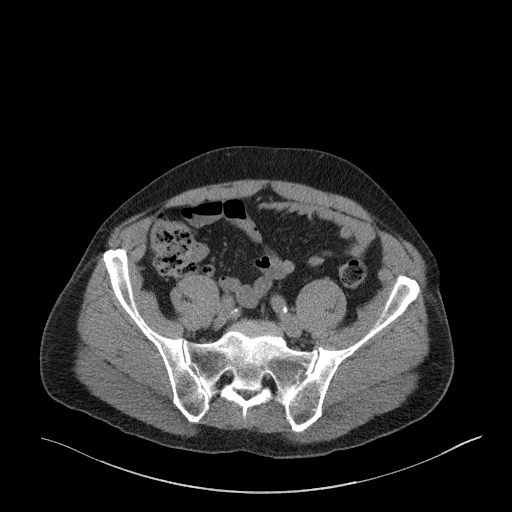
[im 48/95  soft-tissue]
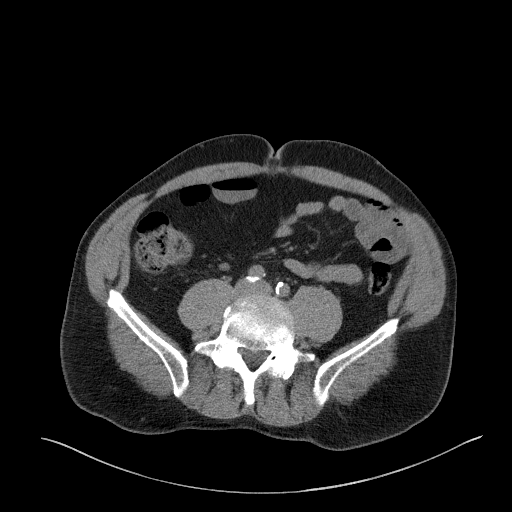
[im 55/95  soft-tissue]
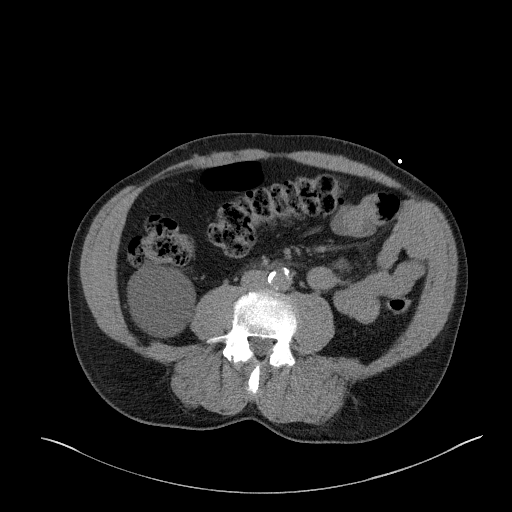
[im 62/95  soft-tissue]
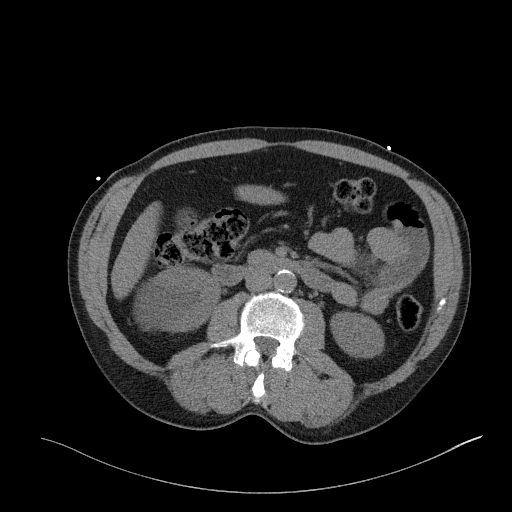
[im 62/95  bone]
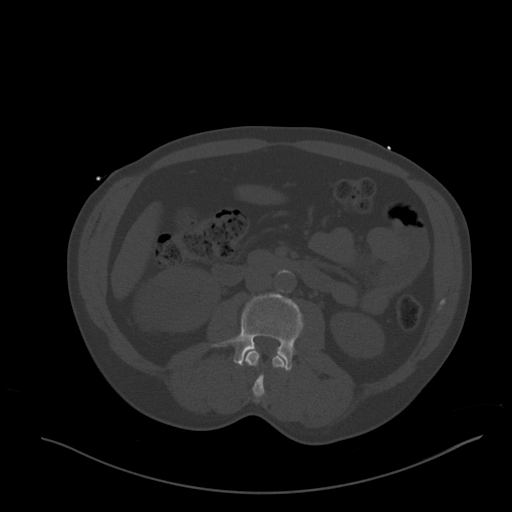
[im 69/95  soft-tissue]
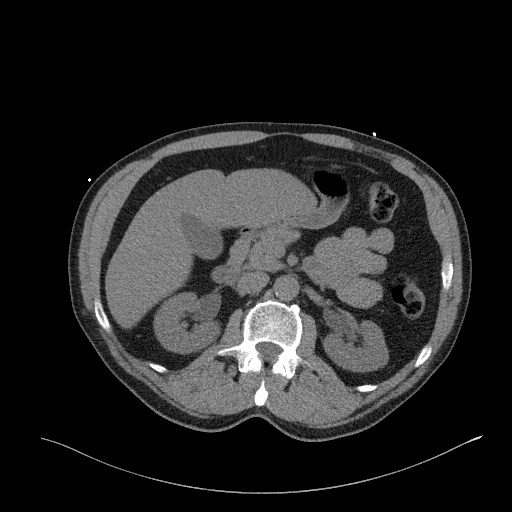
[im 76/95  soft-tissue]
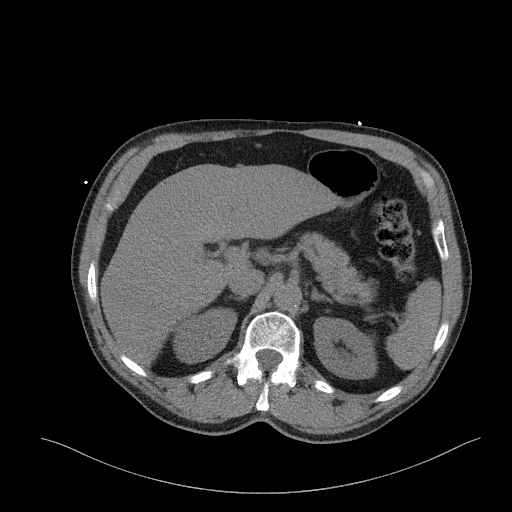
[im 84/95  soft-tissue]
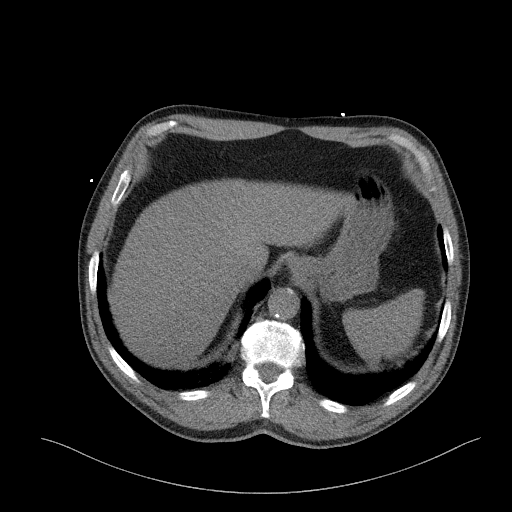
[im 91/95  soft-tissue]
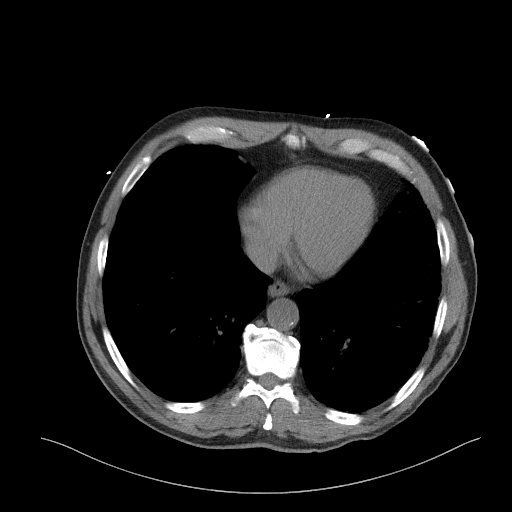

[Series 6: a/p w/o cor · coronal · non-contrast · 0.83mm/px · 3 of 157 slices shown]
[im 53/157  soft-tissue]
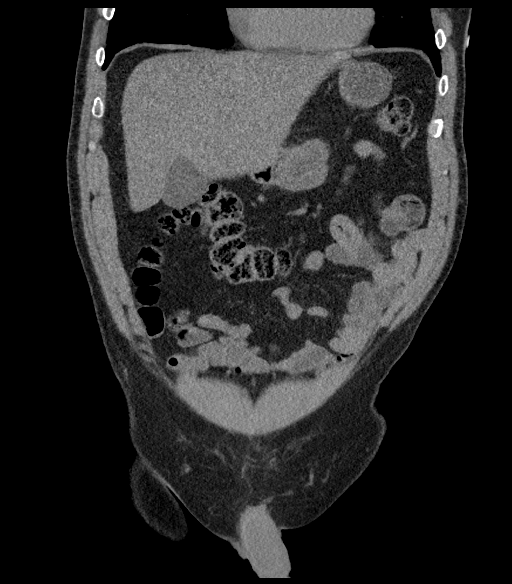
[im 70/157  soft-tissue]
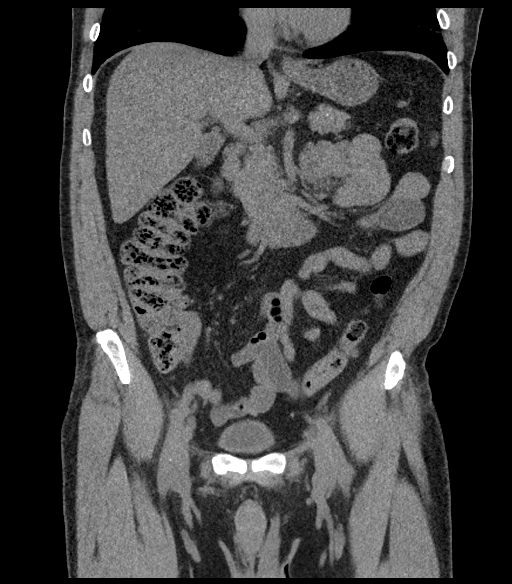
[im 87/157  soft-tissue]
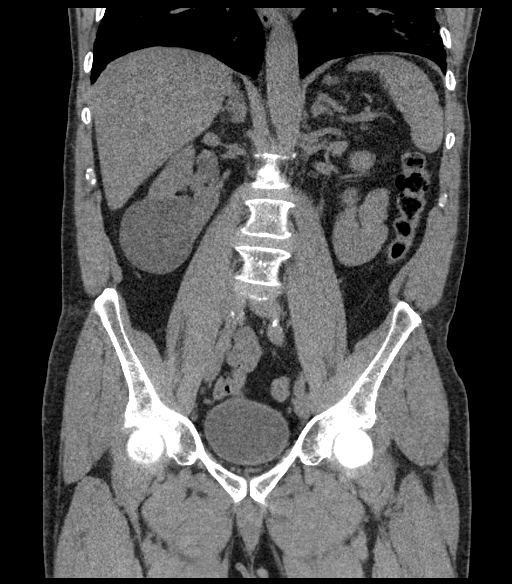

[16 of 46 positions shown; findings below may reference images not displayed]

FINDINGS: Lower chest: Scattered centrilobular and tree-in-bud nodularity in
both lower lobes, and to a lesser extent in the lingula and right
middle lobe.

Hepatobiliary: Hepatic steatosis. No focal liver abnormality. No
gallstones, gallbladder wall thickening, or biliary dilatation.

Pancreas: Unremarkable. No pancreatic ductal dilatation or
surrounding inflammatory changes.

Spleen: Normal in size without focal abnormality.

Adrenals/Urinary Tract: The adrenal glands are unremarkable. Large
6.8 cm simple cyst in the right kidney. No renal or ureteral
calculi. No hydronephrosis. The bladder is unremarkable.

Stomach/Bowel: Stomach is within normal limits. Appendix appears
normal. No evidence of bowel wall thickening, distention, or
inflammatory changes.

Vascular/Lymphatic: Aortic atherosclerosis. No enlarged abdominal or
pelvic lymph nodes.

Reproductive: Mild prostatomegaly.

Other: No free fluid or pneumoperitoneum.

Musculoskeletal: No acute or significant osseous findings. Lower
lumbar facet arthropathy with grade 1 anterolisthesis at L4-L5.
IMPRESSION: 1.  No acute intra-abdominal process.  Normal appendix.
2. Scattered centrilobular and tree-in-bud nodularity in both lung
bases, likely related to aspiration or atypical infection.
3. Hepatic steatosis.
4.  Aortic atherosclerosis ([id number]).

## 2019-09-17 IMAGING — DX DG CHEST 2V
2 series · 2 of 2 positions shown · non-contrast
Comparison: Chest x-ray dated 06/21/2017 in chest x-ray dated
04/28/2017.

CLINICAL DATA: Abdominal pain, productive cough with white sputum,
shortness of breath and chest tightness since yesterday. History of
COPD. Smoker.

EXAM:
CHEST - 2 VIEW

[chest pa]
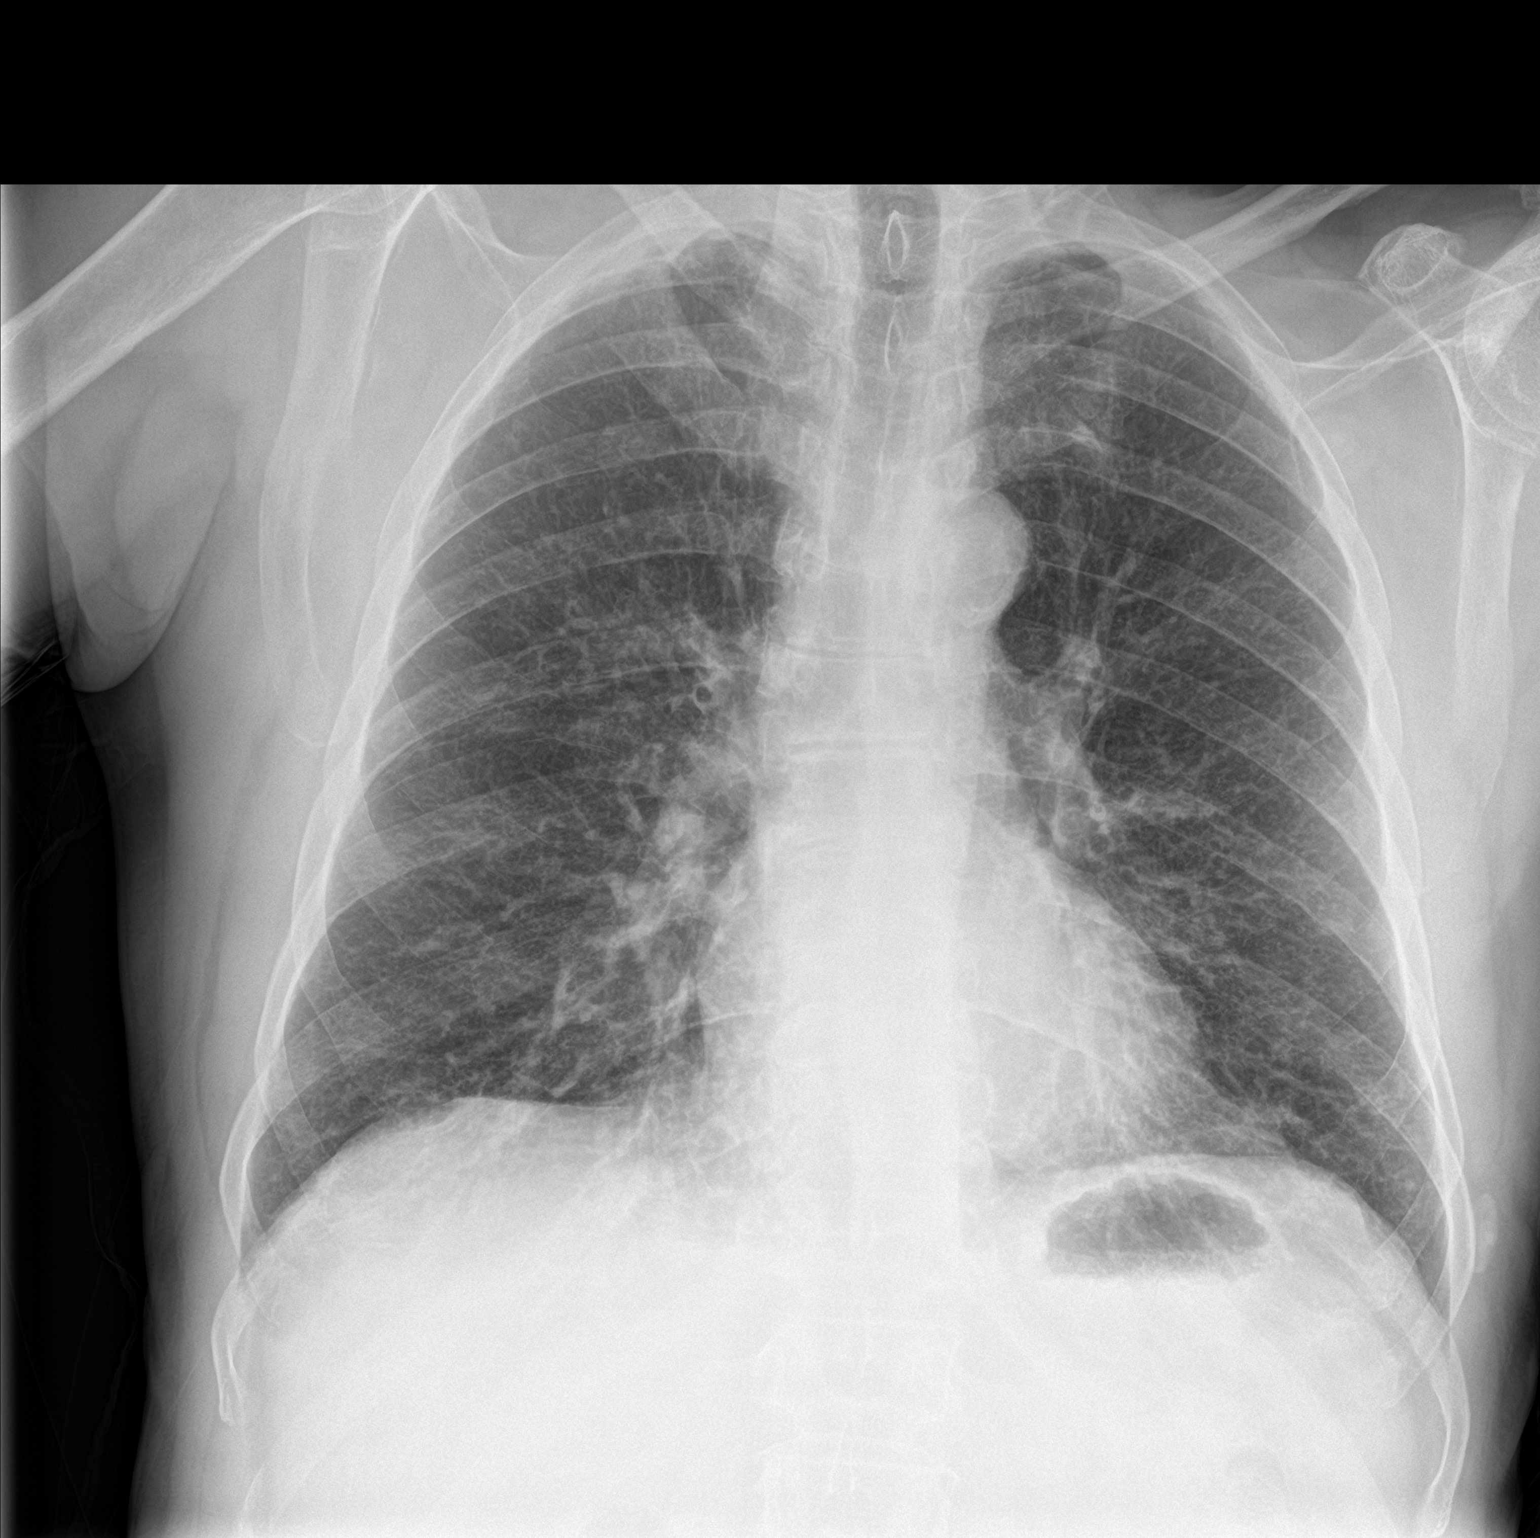

[chest lat]
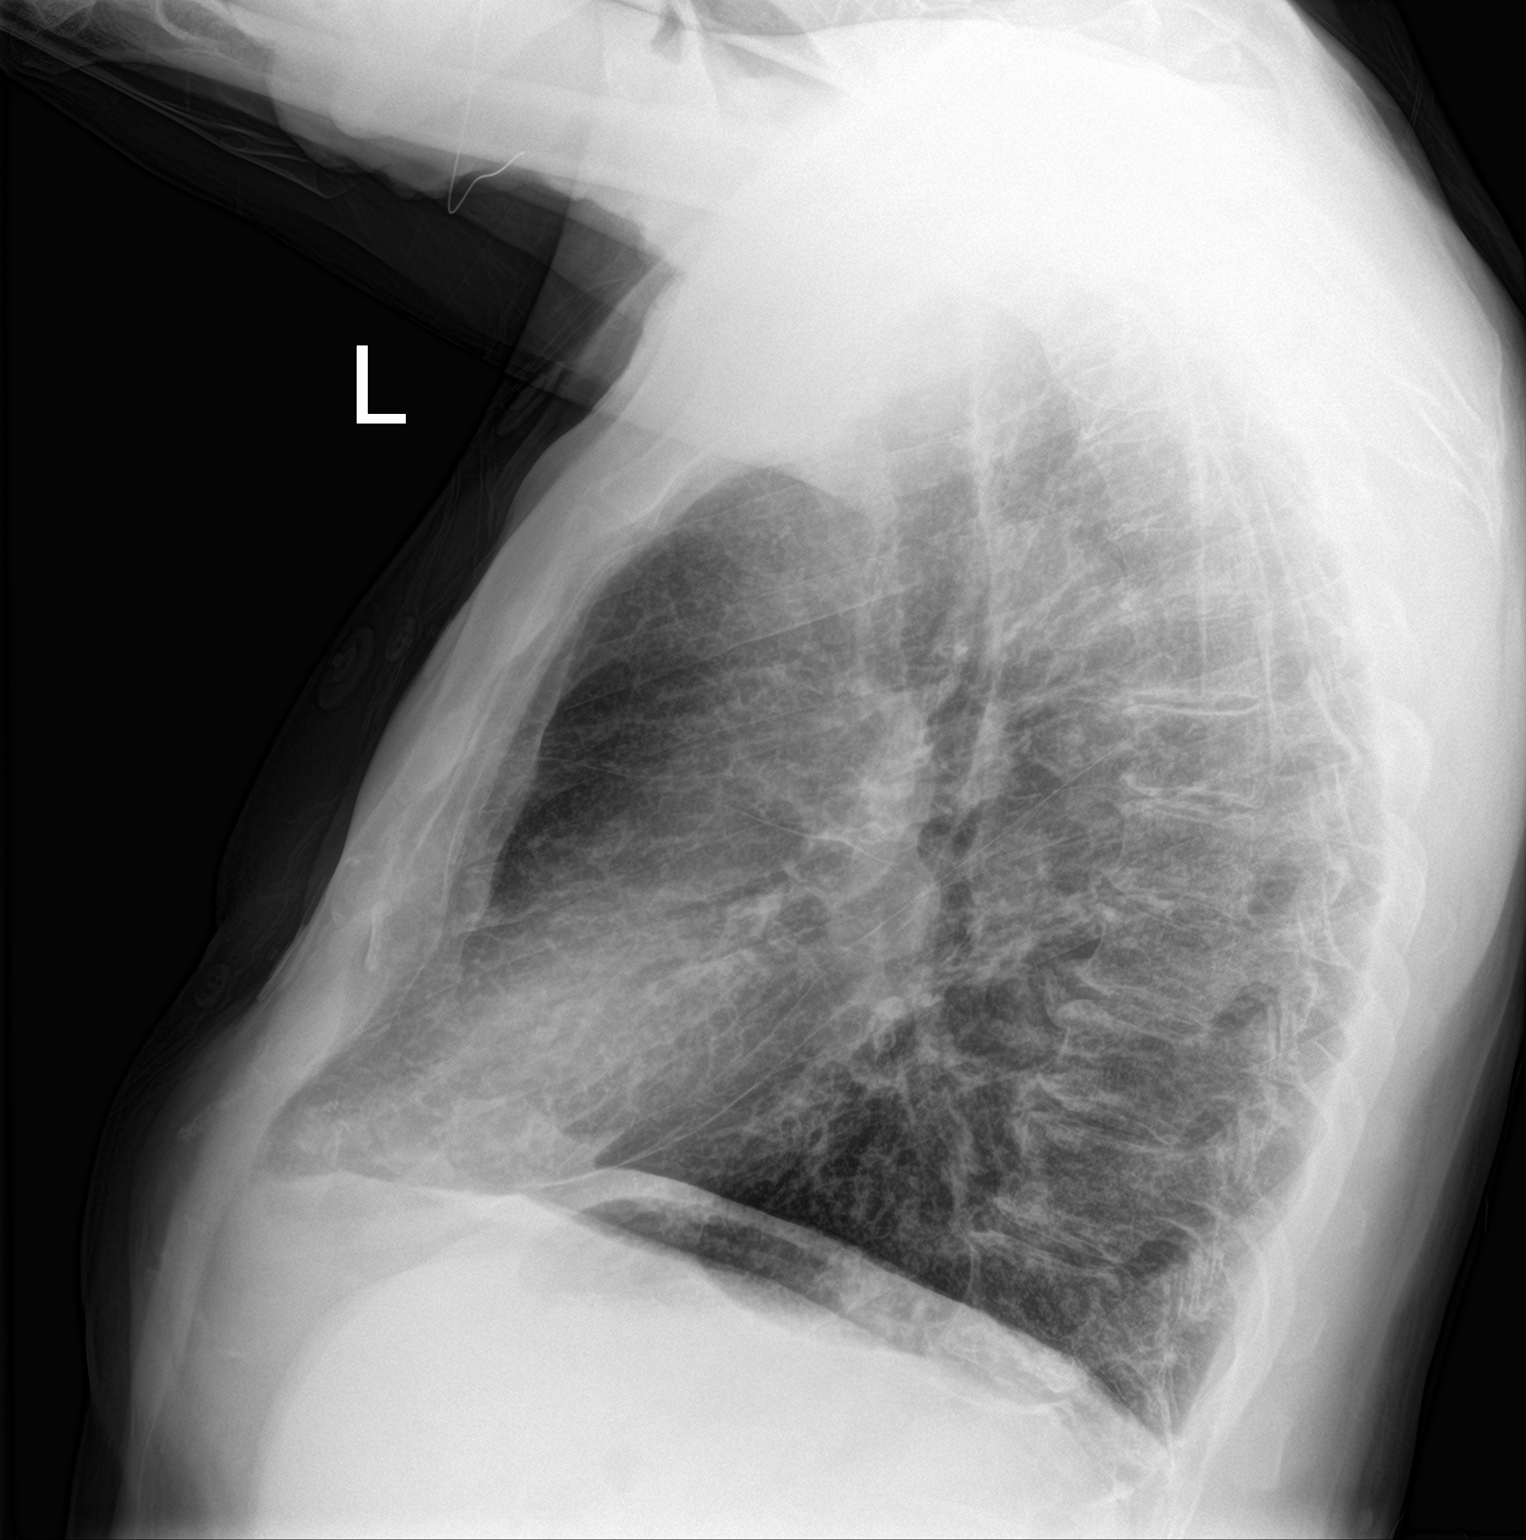

[2 of 2 positions shown; findings below may reference images not displayed]

FINDINGS: Heart size and mediastinal contours are within normal limits. Aortic
atherosclerosis.

Lungs are hyperexpanded. Coarse interstitial lung markings are again
seen bilaterally indicating chronic interstitial lung disease.
Chronic bronchitic changes again noted centrally. No confluent
opacity to suggest a developing pneumonia. No pleural effusion or
pneumothorax seen.

Mild degenerative spurring throughout the thoracic spine. No acute
or suspicious osseous finding.
IMPRESSION: 1. No active cardiopulmonary disease. No evidence of pneumonia or
pulmonary edema.
2. Hyperexpanded lungs, consistent with the given history of COPD.
There is some degree of associated chronic interstitial lung disease
and chronic bronchitic changes.
3. Aortic atherosclerosis.

## 2019-10-23 ENCOUNTER — Other Ambulatory Visit: Payer: Self-pay | Admitting: Internal Medicine

## 2019-11-16 ENCOUNTER — Telehealth: Payer: Self-pay | Admitting: Internal Medicine

## 2019-11-16 NOTE — Telephone Encounter (Signed)
Patient's spouse calling regarding CT order from 07/13/19 visit.   No order in currently.  Please enter an order

## 2019-11-22 NOTE — Telephone Encounter (Signed)
Spoke with pt's wife and informed her that there in current orders for a CT nor do I see where MD want to have one done. Pt's wife states they will discuss it with PCP at 12/02/19 appointment

## 2019-12-02 ENCOUNTER — Ambulatory Visit: Payer: Medicare Other | Admitting: Internal Medicine

## 2019-12-12 ENCOUNTER — Ambulatory Visit: Payer: Medicare Other | Admitting: Internal Medicine

## 2019-12-25 ENCOUNTER — Other Ambulatory Visit: Payer: Self-pay | Admitting: Family Medicine

## 2019-12-25 ENCOUNTER — Telehealth: Payer: Self-pay | Admitting: Family Medicine

## 2019-12-25 MED ORDER — AMOXICILLIN 500 MG PO CAPS: 500.0000 mg | ORAL_CAPSULE | Freq: Three times a day (TID) | ORAL | 0 refills | Status: DC

## 2019-12-25 NOTE — Telephone Encounter (Signed)
Spoke with [facility] RN first patient with COPD and chronic cough is now experiencing increased cough productive of green phlegm, fevers, chills, chest discomfort only with cough. Spoke with his wife and then him. He refuses to go be evaluated. After long discussion in which he never sounds winded or distressed he seems to have agreed to go get COVID tested today. If he worsens he is advised to seek care. He is scheduled for a CT scan tomorrow and he is warned if he does not get tested and receive a negative his CT scan may be postponed.  He reports good response to Amoxicillin in past with similar symptoms and given his risk factors have sent in a refill on this to take for 10 days. If he does not respond he will need to seek further care. If he is COVID positive he is to call the office to get set up with [facility] infusion.  They are advised to purchase a pulse oximeter and monitor his oxygen levels. If it remains in the 90s he can treat in home if it drops to 80s or lower he will need to be seen and receive higher level of care.

## 2019-12-26 ENCOUNTER — Ambulatory Visit: Payer: Medicare Other | Admitting: Internal Medicine

## 2020-01-12 ENCOUNTER — Ambulatory Visit: Payer: Medicare Other | Admitting: Internal Medicine

## 2020-01-18 ENCOUNTER — Other Ambulatory Visit: Payer: Self-pay | Admitting: Family Medicine

## 2020-01-26 ENCOUNTER — Ambulatory Visit: Payer: Medicare Other | Admitting: Internal Medicine

## 2020-01-31 ENCOUNTER — Ambulatory Visit (INDEPENDENT_AMBULATORY_CARE_PROVIDER_SITE_OTHER): Payer: Medicare Other

## 2020-01-31 DIAGNOSIS — Z0000 Encounter for general adult medical examination without abnormal findings: Secondary | ICD-10-CM | POA: Diagnosis not present

## 2020-01-31 NOTE — Progress Notes (Addendum)
I connected with Carlos Hudson today by telephone and verified that I am speaking with the correct person using two identifiers. Location patient: home Location provider: work Persons participating in the virtual visit: Carlos Hudson, wife and [name], LPN.   I discussed the limitations, risks, security and privacy concerns of performing an evaluation and management service by telephone and the availability of in person appointments. I also discussed with the patient that there may be a patient responsible charge related to this service. The patient expressed understanding and verbally consented to this telephonic visit.    Interactive audio and video telecommunications were attempted between this provider and patient, however failed, due to patient having technical difficulties OR patient did not have access to video capability.  We continued and completed visit with audio only.  Some vital signs may be absent or patient reported.   Time Spent with patient on telephone encounter: 30 minutes  Subjective:   Carlos Hudson is a 69 y.o. male who presents for Medicare Annual/Subsequent preventive examination.  Review of Systems    No ROS. Medicare Wellness Visit. Additional risk factors are reflected in social history. Cardiac Risk Factors include: advanced age (>27men, >17 women);family history of premature cardiovascular disease;male gender;smoking/ tobacco exposure     Objective:    Today's Vitals   01/31/20 1321  PainSc: 6    There is no height or weight on file to calculate BMI.  Advanced Directives 01/31/2020  Does Patient Have a Medical Advance Directive? No  Would patient like information on creating a medical advance directive? No - Patient declined    Current Medications (verified) Outpatient Encounter Medications as of 01/31/2020  Medication Sig   albuterol (VENTOLIN HFA) 108 (90 Base) MCG/ACT inhaler INHALE 2 PUFFS INTO THE LUNGS EVERY 4 HOURS AS NEEDED FOR  WHEEZING FOR SHORTNESS OF BREATH   sildenafil (VIAGRA) 100 MG tablet Take 1 tablet (100 mg total) by mouth daily as needed for erectile dysfunction.   alprostadil (EDEX) 10 MCG injection 10 mcg by Intracavitary route as needed for erectile dysfunction. use no more than 3 times per week (Patient not taking: Reported on 01/31/2020)   amoxicillin (AMOXIL) 500 MG capsule Take 1 capsule (500 mg total) by mouth 3 (three) times daily. (Patient not taking: Reported on 01/31/2020)   Cholecalciferol (VITAMIN D3) 50 MCG (2000 UT) capsule Take 1 capsule (2,000 Units total) by mouth daily. (Patient not taking: Reported on 01/31/2020)   ibuprofen (ADVIL) 200 MG tablet Take 200 mg by mouth every 6 (six) hours as needed. (Patient not taking: Reported on 01/31/2020)   meloxicam (MOBIC) 15 MG tablet Take 1 tablet (15 mg total) by mouth daily as needed for pain. (Patient not taking: Reported on 01/31/2020)   tadalafil (CIALIS) 20 MG tablet Take 1 tablet (20 mg total) by mouth every three (3) days as needed for erectile dysfunction.   No facility-administered encounter medications on file as of 01/31/2020.    Allergies (verified) Clindamycin/lincomycin, Fish allergy, Iodinated diagnostic agents, Iodine, Aspirin, and Pepcid [famotidine]   History: Past Medical History:  Diagnosis Date   Asthma    COPD (chronic obstructive pulmonary disease) (HCC)    GERD (gastroesophageal reflux disease)    History reviewed. No pertinent surgical history. Family History  Problem Relation Age of Onset   Alcohol abuse Father    COPD Father    Hypertension Father    Stroke Father    Heart attack Father    Early death Brother    Social  History   Socioeconomic History   Marital status: Married    Spouse name: Not on file   Number of children: Not on file   Years of education: Not on file   Highest education level: Not on file  Occupational History   Not on file  Tobacco Use   Smoking status: Current Every Day Smoker     Packs/day: 0.50    Types: Cigarettes   Smokeless tobacco: Never Used  Substance and Sexual Activity   Alcohol use: No   Drug use: No   Sexual activity: Not on file  Other Topics Concern   Not on file  Social History Narrative   Not on file   Social Determinants of Health   Financial Resource Strain: Low Risk    Difficulty of Paying Living Expenses: Not very hard  Food Insecurity: No Food Insecurity   Worried About Running Out of Food in the Last Year: Never true   Ran Out of Food in the Last Year: Never true  Transportation Needs: No Transportation Needs   Lack of Transportation (Medical): No   Lack of Transportation (Non-Medical): No  Physical Activity:    Days of Exercise per Week: Not on file   Minutes of Exercise per Session: Not on file  Stress: No Stress Concern Present   Feeling of Stress : Not at all  Social Connections:    Frequency of Communication with Friends and Family: Not on file   Frequency of Social Gatherings with Friends and Family: Not on file   Attends Religious Services: Not on file   Active Member of Clubs or Organizations: Not on file   Attends Banker Meetings: Not on file   Marital Status: Not on file    Tobacco Counseling Ready to quit: Not Answered Counseling given: Not Answered   Clinical Intake:  Pre-visit preparation completed: Yes  Pain : 0-10 Pain Score: 6  Pain Type: Chronic pain Pain Location: Knee Pain Orientation: Left, Right Pain Radiating Towards: n/a Pain Descriptors / Indicators: Sore, Discomfort, Aching Pain Onset: More than a month ago Pain Frequency: Intermittent Pain Relieving Factors: Mobic Effect of Pain on Daily Activities: Pain can diminish job performance, lower motivation to exercise, and prevent you from completing daily tasks.  Pain Relieving Factors: Mobic  Nutritional Risks: None Diabetes: No  How often do you need to have someone help you when you read instructions, pamphlets, or other  written materials from your doctor or pharmacy?: 1 - Never What is the last grade level you completed in school?: 9th grade  Diabetic? no  Interpreter Needed?: No  Information entered by :: Susie Cassette, LPN   Activities of Daily Living In your present state of health, do you have any difficulty performing the following activities: 01/31/2020  Hearing? Y  Comment ringing in the ears  Vision? N  Difficulty concentrating or making decisions? Y  Walking or climbing stairs? N  Dressing or bathing? N  Doing errands, shopping? N  Preparing Food and eating ? N  Using the Toilet? N  In the past six months, have you accidently leaked urine? N  Do you have problems with loss of bowel control? N  Managing your Medications? N  Managing your Finances? N  Housekeeping or managing your Housekeeping? N  Some recent data might be hidden    Patient Care Team: Plotnikov, Georgina Quint, MD as PCP - General (Internal Medicine) Valeria Batman, MD as Consulting Physician (Orthopedic Surgery)  Indicate any recent Medical  Services you may have received from other than [facility] providers in the past year (date may be approximate).     Assessment:   This is a routine wellness examination for Carlos Hudson.  Hearing/Vision screen No exam data present  Dietary issues and exercise activities discussed: Current Exercise Habits: The patient does not participate in regular exercise at present, Exercise limited by: respiratory conditions(s);orthopedic condition(s)  Goals   None    Depression Screen PHQ 2/9 Scores 01/31/2020 07/16/2017  PHQ - 2 Score 0 6  PHQ- 9 Score - 18    Hudson Risk Hudson Risk  01/31/2020  Falls in the past year? 1  Number falls in past yr: 0  Injury with Hudson? 1  Risk for Hudson due to : Other (Comment)  Risk for Hudson due to: Comment fell out of bed; hurt knee  Follow up Falls evaluation completed    Any stairs in or around the home? No  If so, are there any without handrails? No    Home free of loose throw rugs in walkways, pet beds, electrical cords, etc? Yes  Adequate lighting in your home to reduce risk of falls? Yes   ASSISTIVE DEVICES UTILIZED TO PREVENT FALLS:  Life alert? No  Use of a cane, walker or w/c? No  Grab bars in the bathroom? Yes  Shower chair or bench in shower? Yes  Elevated toilet seat or a handicapped toilet? Yes   TIMED UP AND GO:  Was the test performed? No .  Length of time to ambulate 10 feet: 0 sec.   Gait steady and fast without use of assistive device  Cognitive Function: Patient is cogitatively intact.        Immunizations *Patient declined all vaccines* Immunization History  Administered Date(s) Administered   Tdap 10/05/2013    TDAP status: Due, Education has been provided regarding the importance of this vaccine. Advised may receive this vaccine at local pharmacy or Health Dept. Aware to provide a copy of the vaccination record if obtained from local pharmacy or Health Dept. Verbalized acceptance and understanding. Flu Vaccine status: Declined, Education has been provided regarding the importance of this vaccine but patient still declined. Advised may receive this vaccine at local pharmacy or Health Dept. Aware to provide a copy of the vaccination record if obtained from local pharmacy or Health Dept. Verbalized acceptance and understanding. Pneumococcal vaccine status: Declined,  Education has been provided regarding the importance of this vaccine but patient still declined. Advised may receive this vaccine at local pharmacy or Health Dept. Aware to provide a copy of the vaccination record if obtained from local pharmacy or Health Dept. Verbalized acceptance and understanding.  Covid-19 vaccine status: Declined, Education has been provided regarding the importance of this vaccine but patient still declined. Advised may receive this vaccine at local pharmacy or Health Dept.or vaccine clinic. Aware to provide a copy of the  vaccination record if obtained from local pharmacy or Health Dept. Verbalized acceptance and understanding.  Qualifies for Shingles Vaccine? Yes   Zostavax completed No   Shingrix Completed?: No.    Education has been provided regarding the importance of this vaccine. Patient has been advised to call insurance company to determine out of pocket expense if they have not yet received this vaccine. Advised may also receive vaccine at local pharmacy or Health Dept. Verbalized acceptance and understanding.  Screening Tests Health Maintenance  Topic Date Due   Hepatitis C Screening  Never done   COVID-19 Vaccine (1) Never done  COLONOSCOPY  Never done   PNA vac Low Risk Adult (1 of 2 - PCV13) Never done   INFLUENZA VACCINE  Never done   TETANUS/TDAP  10/06/2023    Health Maintenance  Health Maintenance Due  Topic Date Due   Hepatitis C Screening  Never done   COVID-19 Vaccine (1) Never done   COLONOSCOPY  Never done   PNA vac Low Risk Adult (1 of 2 - PCV13) Never done   INFLUENZA VACCINE  Never done    Colorectal cancer screening: No longer required.  (patient declined) Lung Cancer Screening: (Low Dose CT Chest recommended if Age 76-80 years, 30 pack-year currently smoking OR have quit w/in 15years.) does qualify.   Lung Cancer Screening Referral: no  Additional Screening:  Hepatitis C Screening: does not qualify; Completed: no  Vision Screening: Recommended annual ophthalmology exams for early detection of glaucoma and other disorders of the eye. Is the patient up to date with their annual eye exam?  No  Who is the provider or what is the name of the office in which the patient attends annual eye exams? Patient declined referral If pt is not established with a provider, would they like to be referred to a provider to establish care? No .   Dental Screening: Recommended annual dental exams for proper oral hygiene  Community Resource Referral / Chronic Care Management: CRR  required this visit?  No   CCM required this visit?  No      Plan:     I have personally reviewed and noted the following in the patient's chart:   Medical and social history Use of alcohol, tobacco or illicit drugs  Current medications and supplements Functional ability and status Nutritional status Physical activity Advanced directives List of other physicians Hospitalizations, surgeries, and ER visits in previous 12 months Vitals Screenings to include cognitive, depression, and falls Referrals and appointments  In addition, I have reviewed and discussed with patient certain preventive protocols, quality metrics, and best practice recommendations. A written personalized care plan for preventive services as well as general preventive health recommendations were provided to patient.     Mickeal Needy, LPN   [id number]   Nurse Notes:  Patient is cogitatively intact. There were no vitals filed for this visit. There is no height or weight on file to calculate BMI. Patient stated that he has no issues with gait or balance; does not use any assistive devices.  Medical screening examination/treatment/procedure(s) were performed by non-physician practitioner and as supervising physician I was immediately available for consultation/collaboration.  I agree with above. Jacinta Shoe, MD

## 2020-01-31 NOTE — Patient Instructions (Addendum)
Mr. Luu , Thank you for taking time to come for your Medicare Wellness Visit. I appreciate your ongoing commitment to your health goals. Please review the following plan we discussed and let me know if I can assist you in the future.   Screening recommendations/referrals: Colonoscopy: declined Recommended yearly ophthalmology/optometry visit for glaucoma screening and checkup Recommended yearly dental visit for hygiene and checkup  Vaccinations: Influenza vaccine: declined Pneumococcal vaccine: declined Tdap vaccine: declined Shingles vaccine: declined   Covid-19: declined  Advanced directives: Advance directive discussed with you today. Even though you declined this today please call our office should you change your mind and we can give you the proper paperwork for you to fill out.  Conditions/risks identified: Yes.  Next appointment: Please schedule your next Medicare Wellness Visit with your Nurse Health Advisor in 1 year by calling [phone]. Preventive Care 69 Years and Older, Male Preventive care refers to lifestyle choices and visits with your health care provider that can promote health and wellness. What does preventive care include?  A yearly physical exam. This is also called an annual well check.  Dental exams once or twice a year.  Routine eye exams. Ask your health care provider how often you should have your eyes checked.  Personal lifestyle choices, including:  Daily care of your teeth and gums.  Regular physical activity.  Eating a healthy diet.  Avoiding tobacco and drug use.  Limiting alcohol use.  Practicing safe sex.  Taking low doses of aspirin every day.  Taking vitamin and mineral supplements as recommended by your health care provider. What happens during an annual well check? The services and screenings done by your health care provider during your annual well check will depend on your age, overall health, lifestyle risk factors, and  family history of disease. Counseling  Your health care provider may ask you questions about your:  Alcohol use.  Tobacco use.  Drug use.  Emotional well-being.  Home and relationship well-being.  Sexual activity.  Eating habits.  History of falls.  Memory and ability to understand (cognition).  Work and work Astronomer. Screening  You may have the following tests or measurements:  Height, weight, and BMI.  Blood pressure.  Lipid and cholesterol levels. These may be checked every 5 years, or more frequently if you are over 72 years old.  Skin check.  Lung cancer screening. You may have this screening every year starting at age 60 if you have a 30-pack-year history of smoking and currently smoke or have quit within the past 15 years.  Fecal occult blood test (FOBT) of the stool. You may have this test every year starting at age 4.  Flexible sigmoidoscopy or colonoscopy. You may have a sigmoidoscopy every 5 years or a colonoscopy every 10 years starting at age 60.  Prostate cancer screening. Recommendations will vary depending on your family history and other risks.  Hepatitis C blood test.  Hepatitis B blood test.  Sexually transmitted disease (STD) testing.  Diabetes screening. This is done by checking your blood sugar (glucose) after you have not eaten for a while (fasting). You may have this done every 1-3 years.  Abdominal aortic aneurysm (AAA) screening. You may need this if you are a current or former smoker.  Osteoporosis. You may be screened starting at age 13 if you are at high risk. Talk with your health care provider about your test results, treatment options, and if necessary, the need for more tests. Vaccines  Your health care  provider may recommend certain vaccines, such as:  Influenza vaccine. This is recommended every year.  Tetanus, diphtheria, and acellular pertussis (Tdap, Td) vaccine. You may need a Td booster every 10 years.  Zoster  vaccine. You may need this after age 67.  Pneumococcal 13-valent conjugate (PCV13) vaccine. One dose is recommended after age 17.  Pneumococcal polysaccharide (PPSV23) vaccine. One dose is recommended after age 67. Talk to your health care provider about which screenings and vaccines you need and how often you need them. This information is not intended to replace advice given to you by your health care provider. Make sure you discuss any questions you have with your health care provider. Document Released: 04/06/2015 Document Revised: 11/28/2015 Document Reviewed: 01/09/2015 Elsevier Interactive Patient Education  2017 [place] Prevention in the Home Falls can cause injuries. They can happen to people of all ages. There are many things you can do to make your home safe and to help prevent falls. What can I do on the outside of my home?  Regularly fix the edges of walkways and driveways and fix any cracks.  Remove anything that might make you trip as you walk through a door, such as a raised step or threshold.  Trim any bushes or trees on the path to your home.  Use bright outdoor lighting.  Clear any walking paths of anything that might make someone trip, such as rocks or tools.  Regularly check to see if handrails are loose or broken. Make sure that both sides of any steps have handrails.  Any raised decks and porches should have guardrails on the edges.  Have any leaves, snow, or ice cleared regularly.  Use sand or salt on walking paths during winter.  Clean up any spills in your garage right away. This includes oil or grease spills. What can I do in the bathroom?  Use night lights.  Install grab bars by the toilet and in the tub and shower. Do not use towel bars as grab bars.  Use non-skid mats or decals in the tub or shower.  If you need to sit down in the shower, use a plastic, non-slip stool.  Keep the floor dry. Clean up any water that spills on the  floor as soon as it happens.  Remove soap buildup in the tub or shower regularly.  Attach bath mats securely with double-sided non-slip rug tape.  Do not have throw rugs and other things on the floor that can make you trip. What can I do in the bedroom?  Use night lights.  Make sure that you have a light by your bed that is easy to reach.  Do not use any sheets or blankets that are too big for your bed. They should not hang down onto the floor.  Have a firm chair that has side arms. You can use this for support while you get dressed.  Do not have throw rugs and other things on the floor that can make you trip. What can I do in the kitchen?  Clean up any spills right away.  Avoid walking on wet floors.  Keep items that you use a lot in easy-to-reach places.  If you need to reach something above you, use a strong step stool that has a grab bar.  Keep electrical cords out of the way.  Do not use floor polish or wax that makes floors slippery. If you must use wax, use non-skid floor wax.  Do not have throw  rugs and other things on the floor that can make you trip. What can I do with my stairs?  Do not leave any items on the stairs.  Make sure that there are handrails on both sides of the stairs and use them. Fix handrails that are broken or loose. Make sure that handrails are as [place] as the stairways.  Check any carpeting to make sure that it is firmly attached to the stairs. Fix any carpet that is loose or worn.  Avoid having throw rugs at the top or bottom of the stairs. If you do have throw rugs, attach them to the floor with carpet tape.  Make sure that you have a light switch at the top of the stairs and the bottom of the stairs. If you do not have them, ask someone to add them for you. What else can I do to help prevent falls?  Wear shoes that:  Do not have high heels.  Have rubber bottoms.  Are comfortable and fit you well.  Are closed at the toe. Do not wear  sandals.  If you use a stepladder:  Make sure that it is fully opened. Do not climb a closed stepladder.  Make sure that both sides of the stepladder are locked into place.  Ask someone to hold it for you, if possible.  Clearly mark and make sure that you can see:  Any grab bars or handrails.  First and last steps.  Where the edge of each step is.  Use tools that help you move around (mobility aids) if they are needed. These include:  Canes.  Walkers.  Scooters.  Crutches.  Turn on the lights when you go into a dark area. Replace any light bulbs as soon as they burn out.  Set up your furniture so you have a clear path. Avoid moving your furniture around.  If any of your floors are uneven, fix them.  If there are any pets around you, be aware of where they are.  Review your medicines with your doctor. Some medicines can make you feel dizzy. This can increase your chance of falling. Ask your doctor what other things that you can do to help prevent falls. This information is not intended to replace advice given to you by your health care provider. Make sure you discuss any questions you have with your health care provider. Document Released: 01/04/2009 Document Revised: 08/16/2015 Document Reviewed: 04/14/2014 Elsevier Interactive Patient Education  2017 [employer].

## 2020-02-02 ENCOUNTER — Encounter: Payer: Self-pay | Admitting: Internal Medicine

## 2020-02-02 ENCOUNTER — Other Ambulatory Visit: Payer: Self-pay

## 2020-02-02 ENCOUNTER — Ambulatory Visit (INDEPENDENT_AMBULATORY_CARE_PROVIDER_SITE_OTHER): Payer: Medicare Other | Admitting: Internal Medicine

## 2020-02-02 DIAGNOSIS — M170 Bilateral primary osteoarthritis of knee: Secondary | ICD-10-CM | POA: Diagnosis not present

## 2020-02-02 DIAGNOSIS — R635 Abnormal weight gain: Secondary | ICD-10-CM

## 2020-02-02 DIAGNOSIS — J449 Chronic obstructive pulmonary disease, unspecified: Secondary | ICD-10-CM

## 2020-02-02 DIAGNOSIS — N529 Male erectile dysfunction, unspecified: Secondary | ICD-10-CM

## 2020-02-02 MED ORDER — TADALAFIL 20 MG PO TABS: 20.0000 mg | ORAL_TABLET | ORAL | 5 refills | Status: DC | PRN

## 2020-02-02 MED ORDER — ALBUTEROL SULFATE HFA 108 (90 BASE) MCG/ACT IN AERS: INHALATION_SPRAY | RESPIRATORY_TRACT | 3 refills | Status: DC

## 2020-02-02 MED ORDER — AMOXICILLIN 500 MG PO CAPS: 500.0000 mg | ORAL_CAPSULE | Freq: Three times a day (TID) | ORAL | 0 refills | Status: DC

## 2020-02-02 NOTE — Assessment & Plan Note (Signed)
PRN knee injections

## 2020-02-02 NOTE — Assessment & Plan Note (Signed)
Cost of Rx discussed - try GoodRx Cialis prn

## 2020-02-02 NOTE — Progress Notes (Signed)
Subjective:  Patient ID: Carlos Hudson, male    DOB: 1950/04/20  Age: 69 y.o. MRN: [id number]  CC: Follow-up (No Energy)   HPI [facility] presents for cough/COPD, knee OA L>R F/u ED Pt needs to get vaccinated for COVID 19 - discussed Smoking 1/2 ppd  Outpatient Medications Prior to Visit  Medication Sig Dispense Refill  . albuterol (VENTOLIN HFA) 108 (90 Base) MCG/ACT inhaler INHALE 2 PUFFS INTO THE LUNGS EVERY 4 HOURS AS NEEDED FOR WHEEZING FOR SHORTNESS OF BREATH 27 g 3  . alprostadil (EDEX) 10 MCG injection 10 mcg by Intracavitary route as needed for erectile dysfunction. use no more than 3 times per week (Patient not taking: Reported on 01/31/2020) 3 each 5  . ibuprofen (ADVIL) 200 MG tablet Take 200 mg by mouth every 6 (six) hours as needed. (Patient not taking: Reported on 01/31/2020)    . meloxicam (MOBIC) 15 MG tablet Take 1 tablet (15 mg total) by mouth daily as needed for pain. (Patient not taking: Reported on 01/31/2020) 30 tablet 1  . sildenafil (VIAGRA) 100 MG tablet Take 1 tablet (100 mg total) by mouth daily as needed for erectile dysfunction. 12 tablet 2  . tadalafil (CIALIS) 20 MG tablet Take 1 tablet (20 mg total) by mouth every three (3) days as needed for erectile dysfunction. 10 tablet 5  . amoxicillin (AMOXIL) 500 MG capsule Take 1 capsule (500 mg total) by mouth 3 (three) times daily. (Patient not taking: Reported on 01/31/2020) 30 capsule 0  . Cholecalciferol (VITAMIN D3) 50 MCG (2000 UT) capsule Take 1 capsule (2,000 Units total) by mouth daily. (Patient not taking: Reported on 01/31/2020) 100 capsule 3   No facility-administered medications prior to visit.    ROS: Review of Systems  Constitutional: Negative for appetite change, fatigue and unexpected weight change.  HENT: Negative for congestion, nosebleeds, sneezing, sore throat and trouble swallowing.   Eyes: Negative for itching and visual disturbance.  Respiratory: Negative for cough.   Cardiovascular:  Negative for chest pain, palpitations and leg swelling.  Gastrointestinal: Negative for abdominal distention, blood in stool, diarrhea and nausea.  Genitourinary: Negative for frequency and hematuria.  Musculoskeletal: Negative for back pain, gait problem, joint swelling and neck pain.  Skin: Negative for rash.  Neurological: Negative for dizziness, tremors, speech difficulty and weakness.  Psychiatric/Behavioral: Negative for agitation, dysphoric mood, sleep disturbance and suicidal ideas. The patient is not nervous/anxious.     Objective:  BP 140/78 (BP Location: Left Arm)   Pulse 76   Temp 98.4 F (36.9 C) (Oral)   Wt 217 lb 9.6 oz (98.7 kg)   SpO2 94%   BMI 28.71 kg/m   BP Readings from Last 3 Encounters:  02/02/20 140/78  08/25/19 130/80  07/13/19 134/66    Wt Readings from Last 3 Encounters:  02/02/20 217 lb 9.6 oz (98.7 kg)  08/25/19 220 lb 9.6 oz (100.1 kg)  07/13/19 222 lb (100.7 kg)    Physical Exam Constitutional:      General: He is not in acute distress.    Appearance: He is well-developed.     Comments: NAD  Eyes:     Conjunctiva/sclera: Conjunctivae normal.     Pupils: Pupils are equal, round, and reactive to light.  Neck:     Thyroid: No thyromegaly.     Vascular: No JVD.  Cardiovascular:     Rate and Rhythm: Normal rate and regular rhythm.     Heart sounds: Normal heart sounds. No murmur heard.  No friction rub. No gallop.   Pulmonary:     Effort: Pulmonary effort is normal. No respiratory distress.     Breath sounds: Normal breath sounds. No wheezing or rales.  Chest:     Chest wall: No tenderness.  Abdominal:     General: Bowel sounds are normal. There is no distension.     Palpations: Abdomen is soft. There is no mass.     Tenderness: There is no abdominal tenderness. There is no guarding or rebound.  Musculoskeletal:        General: No tenderness. Normal range of motion.     Cervical back: Normal range of motion.  Lymphadenopathy:      Cervical: No cervical adenopathy.  Skin:    General: Skin is warm and dry.     Findings: No rash.  Neurological:     Mental Status: He is alert and oriented to person, place, and time.     Cranial Nerves: No cranial nerve deficit.     Motor: No abnormal muscle tone.     Coordination: Coordination normal.     Gait: Gait normal.     Deep Tendon Reflexes: Reflexes are normal and symmetric.  Psychiatric:        Behavior: Behavior normal.        Thought Content: Thought content normal.        Judgment: Judgment normal.     Lab Results  Component Value Date   WBC 7.6 08/25/2019   HGB 15.1 08/25/2019   HCT 43.2 08/25/2019   PLT 281.0 08/25/2019   GLUCOSE 127 (H) 08/25/2019   CHOL 164 08/25/2019   TRIG 209.0 (H) 08/25/2019   HDL 29.70 (L) 08/25/2019   LDLDIRECT 86.0 08/25/2019   LDLCALC 109 (H) 10/06/2018   ALT 24 08/25/2019   AST 19 08/25/2019   NA 138 08/25/2019   K 4.4 08/25/2019   CL 100 08/25/2019   CREATININE 1.08 08/25/2019   BUN 17 08/25/2019   CO2 30 08/25/2019   TSH 3.79 08/25/2019   PSA 1.71 08/25/2019    DG Chest 2 View  Result Date: 05/10/2018 CLINICAL DATA:  Chest pain and cough 1 month. EXAM: CHEST - 2 VIEW COMPARISON:  04/28/2017 FINDINGS: Lungs are adequately inflated with airspace consolidation seen best anteriorly on the lateral film in the region of the right middle lobe or lingula. No effusion. Cardiomediastinal silhouette and remainder of the exam is unchanged. IMPRESSION: Airspace process over the anterior mid lungs on the lateral film likely either involving the right middle lobe or lingula and likely due to infection/pneumonia. Electronically Signed   By: Elberta Fortis M.D.   On: 05/10/2018 10:01    Assessment & Plan:    Sonda Primes, MD

## 2020-02-02 NOTE — Assessment & Plan Note (Signed)
Wt Readings from Last 3 Encounters:  02/02/20 217 lb 9.6 oz (98.7 kg)  08/25/19 220 lb 9.6 oz (100.1 kg)  07/13/19 222 lb (100.7 kg)  on diet

## 2020-02-02 NOTE — Assessment & Plan Note (Signed)
Albuterol prn 1/2ppd

## 2020-02-27 ENCOUNTER — Ambulatory Visit: Payer: Medicare Other | Admitting: Podiatry

## 2020-03-27 ENCOUNTER — Ambulatory Visit: Payer: Medicare Other | Admitting: Podiatry

## 2020-04-23 ENCOUNTER — Telehealth (INDEPENDENT_AMBULATORY_CARE_PROVIDER_SITE_OTHER): Payer: Medicare Other | Admitting: Internal Medicine

## 2020-04-23 ENCOUNTER — Encounter: Payer: Self-pay | Admitting: Internal Medicine

## 2020-04-23 DIAGNOSIS — Z720 Tobacco use: Secondary | ICD-10-CM | POA: Diagnosis not present

## 2020-04-23 DIAGNOSIS — N529 Male erectile dysfunction, unspecified: Secondary | ICD-10-CM | POA: Diagnosis not present

## 2020-04-23 DIAGNOSIS — J0101 Acute recurrent maxillary sinusitis: Secondary | ICD-10-CM | POA: Diagnosis not present

## 2020-04-23 DIAGNOSIS — J019 Acute sinusitis, unspecified: Secondary | ICD-10-CM | POA: Insufficient documentation

## 2020-04-23 DIAGNOSIS — J449 Chronic obstructive pulmonary disease, unspecified: Secondary | ICD-10-CM

## 2020-04-23 MED ORDER — AMOXICILLIN-POT CLAVULANATE 875-125 MG PO TABS: 1.0000 | ORAL_TABLET | Freq: Two times a day (BID) | ORAL | 0 refills | Status: DC

## 2020-04-23 MED ORDER — TADALAFIL 20 MG PO TABS: 20.0000 mg | ORAL_TABLET | ORAL | 5 refills | Status: DC | PRN

## 2020-04-23 MED ORDER — METHYLPREDNISOLONE 4 MG PO TBPK: ORAL_TABLET | ORAL | 0 refills | Status: DC

## 2020-04-23 NOTE — Assessment & Plan Note (Signed)
Carlos Hudson needs to stop smoking.  He knows.  Discussed.

## 2020-04-23 NOTE — Assessment & Plan Note (Signed)
Viagra renewed.

## 2020-04-23 NOTE — Progress Notes (Signed)
Virtual Visit via Telephone Note  I connected with Carlos Hudson on 04/23/20 at 10:00 AM EST by telephone and verified that I am speaking with the correct person using two identifiers.  Location: Patient: At work Provider: [facility] office Me and the pt present at the visit virtually   I discussed the limitations, risks, security and privacy concerns of performing an evaluation and management service by telephone and the availability of in person appointments. I also discussed with the patient that there may be a patient responsible charge related to this service. The patient expressed understanding and agreed to proceed.   History of Present Illness: C/o cough, congestion, wheezing of several days duration.  He took amoxicillin and finished that a couple weeks ago.  He got better and now it is worse again.   Observations/Objective:  The patient sounds normal on the phone Assessment and Plan: See plan  Follow Up Instructions:    I discussed the assessment and treatment plan with the patient. The patient was provided an opportunity to ask questions and all were answered. The patient agreed with the plan and demonstrated an understanding of the instructions.   The patient was advised to call back or seek an in-person evaluation if the symptoms worsen or if the condition fails to improve as anticipated.  I provided 22 minutes of non-face-to-face time during this encounter.   Sonda Primes, MD

## 2020-04-23 NOTE — Assessment & Plan Note (Signed)
Exacerbation.  Augmentin for 10 days.  Medrol Dosepak.  Advised to stop smoking

## 2020-04-23 NOTE — Assessment & Plan Note (Signed)
Augmentin for 10 days.  Stop smoking

## 2020-10-10 ENCOUNTER — Ambulatory Visit: Payer: Medicare Other | Admitting: Internal Medicine

## 2020-10-16 ENCOUNTER — Ambulatory Visit: Payer: Medicare Other | Admitting: Internal Medicine

## 2020-11-05 ENCOUNTER — Ambulatory Visit: Payer: Medicare Other | Admitting: Internal Medicine

## 2021-02-12 ENCOUNTER — Other Ambulatory Visit: Payer: Self-pay | Admitting: Internal Medicine

## 2021-02-27 ENCOUNTER — Telehealth: Payer: Self-pay | Admitting: Internal Medicine

## 2021-02-27 NOTE — Telephone Encounter (Signed)
Patient's spouse states patient has stomach pain and ear pressure  Provider's next ov availability is 1-4, asked caller is patient willing to wait or want to be seen sooner, caller states patient will wait

## 2021-03-12 ENCOUNTER — Other Ambulatory Visit: Payer: Self-pay

## 2021-03-12 ENCOUNTER — Ambulatory Visit: Payer: Medicare Other

## 2021-03-27 ENCOUNTER — Ambulatory Visit: Payer: Medicare Other | Admitting: Internal Medicine

## 2021-04-29 ENCOUNTER — Ambulatory Visit: Payer: Medicare Other | Admitting: Internal Medicine

## 2021-05-22 ENCOUNTER — Encounter: Payer: Medicare Other | Admitting: Internal Medicine

## 2021-06-10 ENCOUNTER — Other Ambulatory Visit: Payer: Self-pay | Admitting: Internal Medicine

## 2021-06-24 ENCOUNTER — Ambulatory Visit: Payer: Medicare Other | Admitting: Internal Medicine

## 2021-06-24 ENCOUNTER — Other Ambulatory Visit: Payer: Self-pay | Admitting: Internal Medicine

## 2021-07-09 ENCOUNTER — Ambulatory Visit: Payer: Medicare Other | Admitting: Internal Medicine

## 2021-09-10 ENCOUNTER — Encounter: Payer: Self-pay | Admitting: Internal Medicine

## 2021-09-10 ENCOUNTER — Ambulatory Visit (INDEPENDENT_AMBULATORY_CARE_PROVIDER_SITE_OTHER): Payer: Medicare Other | Admitting: Internal Medicine

## 2021-09-10 VITALS — BP 122/68 | HR 75 | Temp 98.1°F | Ht 73.0 in | Wt 215.0 lb

## 2021-09-10 DIAGNOSIS — Z720 Tobacco use: Secondary | ICD-10-CM | POA: Diagnosis not present

## 2021-09-10 DIAGNOSIS — J449 Chronic obstructive pulmonary disease, unspecified: Secondary | ICD-10-CM

## 2021-09-10 DIAGNOSIS — R635 Abnormal weight gain: Secondary | ICD-10-CM

## 2021-09-10 MED ORDER — LORATADINE 10 MG PO TABS: 10.0000 mg | ORAL_TABLET | Freq: Every day | ORAL | 11 refills | Status: DC

## 2021-09-10 MED ORDER — DOXYCYCLINE HYCLATE 100 MG PO TABS: 100.0000 mg | ORAL_TABLET | Freq: Two times a day (BID) | ORAL | 0 refills | Status: DC

## 2021-09-10 MED ORDER — METHYLPREDNISOLONE 4 MG PO TBPK: ORAL_TABLET | ORAL | 0 refills | Status: DC

## 2021-09-10 NOTE — Assessment & Plan Note (Signed)
1.5 PPD - he needs to stop

## 2021-09-10 NOTE — Assessment & Plan Note (Signed)
Wt Readings from Last 3 Encounters:  09/10/21 215 lb (97.5 kg)  02/02/20 217 lb 9.6 oz (98.7 kg)  08/25/19 220 lb 9.6 oz (100.1 kg)  Better on diet

## 2021-09-10 NOTE — Assessment & Plan Note (Addendum)
Worse Start Medrol pack Doxy x 10 d CXR Cut back on smoking

## 2021-09-10 NOTE — Progress Notes (Signed)
Subjective:  Patient ID: Carlos Hudson, male    DOB: 08-12-1950  Age: 71 y.o. MRN: [id number]  CC: No chief complaint on file.   HPI Carlos Hudson presents for cough, rattling in the chest - worse. Coughing up white sputum. Smoking 1.5 ppd  Outpatient Medications Prior to Visit  Medication Sig Dispense Refill   albuterol (VENTOLIN HFA) 108 (90 Base) MCG/ACT inhaler INHALE 2 PUFFS BY MOUTH EVERY 4 HOURS AS NEEDED FOR WHEEZING FOR SHORTNESS OF BREATH 27 g 0   tadalafil (CIALIS) 20 MG tablet TAKE 1 TABLET BY MOUTH EVERY 3 DAYS FOR ERECTILE DYSFUNCTION 30 tablet 0   alprostadil (EDEX) 10 MCG injection 10 mcg by Intracavitary route as needed for erectile dysfunction. use no more than 3 times per week (Patient not taking: Reported on 01/31/2020) 3 each 5   amoxicillin (AMOXIL) 500 MG capsule Take 1 capsule (500 mg total) by mouth 3 (three) times daily. 30 capsule 0   amoxicillin-clavulanate (AUGMENTIN) 875-125 MG tablet Take 1 tablet by mouth 2 (two) times daily. 20 tablet 0   ibuprofen (ADVIL) 200 MG tablet Take 200 mg by mouth every 6 (six) hours as needed. (Patient not taking: Reported on 01/31/2020)     meloxicam (MOBIC) 15 MG tablet Take 1 tablet (15 mg total) by mouth daily as needed for pain. (Patient not taking: Reported on 01/31/2020) 30 tablet 1   methylPREDNISolone (MEDROL DOSEPAK) 4 MG TBPK tablet As directed 21 tablet 0   No facility-administered medications prior to visit.    ROS: Review of Systems  Constitutional:  Negative for appetite change, fatigue and unexpected weight change.  HENT:  Positive for postnasal drip, rhinorrhea and sinus pressure. Negative for congestion, nosebleeds, sneezing, sore throat and trouble swallowing.   Eyes:  Negative for itching and visual disturbance.  Respiratory:  Positive for wheezing. Negative for cough and shortness of breath.   Cardiovascular:  Negative for chest pain, palpitations and leg swelling.  Gastrointestinal:  Negative for abdominal  distention, blood in stool, diarrhea and nausea.  Genitourinary:  Negative for frequency and hematuria.  Musculoskeletal:  Negative for back pain, gait problem, joint swelling and neck pain.  Skin:  Negative for rash.  Neurological:  Negative for dizziness, tremors, speech difficulty and weakness.  Psychiatric/Behavioral:  Negative for agitation, dysphoric mood and sleep disturbance. The patient is not nervous/anxious.     Objective:  BP 122/68 (BP Location: Left Arm, Patient Position: Sitting, Cuff Size: Normal)   Pulse 75   Temp 98.1 F (36.7 C) (Oral)   Ht 6\' 1"  (1.854 m)   Wt 215 lb (97.5 kg)   SpO2 (!) 89%   BMI 28.37 kg/m   BP Readings from Last 3 Encounters:  09/10/21 122/68  02/02/20 140/78  08/25/19 130/80    Wt Readings from Last 3 Encounters:  09/10/21 215 lb (97.5 kg)  02/02/20 217 lb 9.6 oz (98.7 kg)  08/25/19 220 lb 9.6 oz (100.1 kg)    Physical Exam Constitutional:      General: He is not in acute distress.    Appearance: He is well-developed. He is obese.     Comments: NAD  Eyes:     Conjunctiva/sclera: Conjunctivae normal.     Pupils: Pupils are equal, round, and reactive to light.  Neck:     Thyroid: No thyromegaly.     Vascular: No JVD.  Cardiovascular:     Rate and Rhythm: Normal rate and regular rhythm.     Heart sounds: Normal heart  sounds. No murmur heard.    No friction rub. No gallop.  Pulmonary:     Effort: Pulmonary effort is normal. No respiratory distress.     Breath sounds: Rhonchi present. No wheezing or rales.  Chest:     Chest wall: No tenderness.  Abdominal:     General: Bowel sounds are normal. There is no distension.     Palpations: Abdomen is soft. There is no mass.     Tenderness: There is no abdominal tenderness. There is no guarding or rebound.  Musculoskeletal:        General: No tenderness. Normal range of motion.     Cervical back: Normal range of motion.  Lymphadenopathy:     Cervical: No cervical adenopathy.   Skin:    General: Skin is warm and dry.     Findings: No rash.  Neurological:     Mental Status: He is alert and oriented to person, place, and time.     Cranial Nerves: No cranial nerve deficit.     Motor: No abnormal muscle tone.     Coordination: Coordination normal.     Gait: Gait normal.     Deep Tendon Reflexes: Reflexes are normal and symmetric.  Psychiatric:        Behavior: Behavior normal.        Thought Content: Thought content normal.        Judgment: Judgment normal.      Lab Results  Component Value Date   WBC 7.6 08/25/2019   HGB 15.1 08/25/2019   HCT 43.2 08/25/2019   PLT 281.0 08/25/2019   GLUCOSE 127 (H) 08/25/2019   CHOL 164 08/25/2019   TRIG 209.0 (H) 08/25/2019   HDL 29.70 (L) 08/25/2019   LDLDIRECT 86.0 08/25/2019   LDLCALC 109 (H) 10/06/2018   ALT 24 08/25/2019   AST 19 08/25/2019   NA 138 08/25/2019   K 4.4 08/25/2019   CL 100 08/25/2019   CREATININE 1.08 08/25/2019   BUN 17 08/25/2019   CO2 30 08/25/2019   TSH 3.79 08/25/2019   PSA 1.71 08/25/2019    DG Chest 2 View  Result Date: 05/10/2018 CLINICAL DATA:  Chest pain and cough 1 month. EXAM: CHEST - 2 VIEW COMPARISON:  04/28/2017 FINDINGS: Lungs are adequately inflated with airspace consolidation seen best anteriorly on the lateral film in the region of the right middle lobe or lingula. No effusion. Cardiomediastinal silhouette and remainder of the exam is unchanged. IMPRESSION: Airspace process over the anterior mid lungs on the lateral film likely either involving the right middle lobe or lingula and likely due to infection/pneumonia. Electronically Signed   By: Elberta Fortis M.D.   On: 05/10/2018 10:01    Assessment & Plan:   Problem List Items Addressed This Visit     COPD mixed type (HCC) - Primary    Worse Start Medrol pack Doxy x 10 d CXR Cut back on smoking      Relevant Medications   loratadine (CLARITIN) 10 MG tablet   methylPREDNISolone (MEDROL DOSEPAK) 4 MG TBPK  tablet   Other Relevant Orders   DG Chest 2 View   Tobacco abuse disorder    1.5 PPD - he needs to stop      Weight gain    Wt Readings from Last 3 Encounters:  09/10/21 215 lb (97.5 kg)  02/02/20 217 lb 9.6 oz (98.7 kg)  08/25/19 220 lb 9.6 oz (100.1 kg)  Better on diet  Meds ordered this encounter  Medications   loratadine (CLARITIN) 10 MG tablet    Sig: Take 1 tablet (10 mg total) by mouth daily.    Dispense:  30 tablet    Refill:  11   methylPREDNISolone (MEDROL DOSEPAK) 4 MG TBPK tablet    Sig: As directed    Dispense:  21 tablet    Refill:  0   doxycycline (VIBRA-TABS) 100 MG tablet    Sig: Take 1 tablet (100 mg total) by mouth 2 (two) times daily.    Dispense:  20 tablet    Refill:  0      Follow-up: Return in about 3 months (around 12/11/2021) for Wellness Exam.  Sonda Primes, MD

## 2021-10-17 ENCOUNTER — Telehealth: Payer: Self-pay | Admitting: Internal Medicine

## 2021-10-17 NOTE — Telephone Encounter (Signed)
Left message for patient to call back to schedule Medicare Annual Wellness Visit   Last AWV  01/31/20  Please schedule at anytime with [facility] Advisor if patient calls the office back.    Any questions, please call me at [phone]

## 2021-10-26 ENCOUNTER — Other Ambulatory Visit: Payer: Self-pay | Admitting: Internal Medicine

## 2021-11-27 ENCOUNTER — Emergency Department (HOSPITAL_COMMUNITY)
Admission: EM | Admit: 2021-11-27 | Discharge: 2021-11-28 | Disposition: A | Discharge status: Home / Self Care | Payer: Medicare Other | Attending: Emergency Medicine | Admitting: Emergency Medicine

## 2021-11-27 ENCOUNTER — Encounter (HOSPITAL_COMMUNITY): Payer: Self-pay | Admitting: Emergency Medicine

## 2021-11-27 ENCOUNTER — Emergency Department (HOSPITAL_COMMUNITY): Payer: Medicare Other

## 2021-11-27 DIAGNOSIS — R0602 Shortness of breath: Secondary | ICD-10-CM | POA: Diagnosis not present

## 2021-11-27 DIAGNOSIS — J9811 Atelectasis: Secondary | ICD-10-CM | POA: Diagnosis not present

## 2021-11-27 DIAGNOSIS — R062 Wheezing: Secondary | ICD-10-CM | POA: Diagnosis not present

## 2021-11-27 DIAGNOSIS — J45909 Unspecified asthma, uncomplicated: Secondary | ICD-10-CM | POA: Insufficient documentation

## 2021-11-27 DIAGNOSIS — Z743 Need for continuous supervision: Secondary | ICD-10-CM | POA: Diagnosis not present

## 2021-11-27 DIAGNOSIS — J441 Chronic obstructive pulmonary disease with (acute) exacerbation: Secondary | ICD-10-CM | POA: Insufficient documentation

## 2021-11-27 LAB — CBC
HCT: 50.2 % (ref 39.0–52.0)
Hemoglobin: 16.4 g/dL (ref 13.0–17.0)
MCH: 29.3 pg (ref 26.0–34.0)
MCHC: 32.7 g/dL (ref 30.0–36.0)
MCV: 89.8 fL (ref 80.0–100.0)
Platelets: 294 10*3/uL (ref 150–400)
RBC: 5.59 MIL/uL (ref 4.22–5.81)
RDW: 14.6 % (ref 11.5–15.5)
WBC: 9.6 10*3/uL (ref 4.0–10.5)
nRBC: 0 % (ref 0.0–0.2)

## 2021-11-27 LAB — COMPREHENSIVE METABOLIC PANEL
ALT: 34 U/L (ref 0–44)
AST: 31 U/L (ref 15–41)
Albumin: 4.4 g/dL (ref 3.5–5.0)
Alkaline Phosphatase: 48 U/L (ref 38–126)
Anion gap: 9 (ref 5–15)
BUN: 18 mg/dL (ref 8–23)
CO2: 23 mmol/L (ref 22–32)
Calcium: 9.4 mg/dL (ref 8.9–10.3)
Chloride: 105 mmol/L (ref 98–111)
Creatinine, Ser: 1.25 mg/dL — ABNORMAL HIGH (ref 0.61–1.24)
GFR, Estimated: >60 mL/min (ref >60)
Glucose, Bld: 110 mg/dL — ABNORMAL HIGH (ref 70–99)
Potassium: 4 mmol/L (ref 3.5–5.1)
Sodium: 137 mmol/L (ref 135–145)
Total Bilirubin: 0.9 mg/dL (ref 0.3–1.2)
Total Protein: 7.6 g/dL (ref 6.5–8.1)

## 2021-11-27 NOTE — ED Provider Triage Note (Signed)
Emergency Medicine Provider Triage Evaluation Note  Carlos Hudson , a 71 y.o. male  was evaluated in triage.  Pt complains of SOB. Ran out of albuterol inhaler a few days ago and has been SOB since. Uses it 2-4 times a day.   Still smokes 1-1.5 ppd.   Received duoneb/solumed by EMS states he feels better than before.   No NV or CP.   Review of Systems  Positive: SOB Negative: Fever   Physical Exam  BP 124/68 (BP Location: Right Arm)   Pulse 79   Temp 98.4 F (36.9 C) (Oral)   Resp (!) 22   SpO2 92%  Gen:   Awake, no distress   Resp:  Normal effort  MSK:   Moves extremities without difficulty  Other:  Lungs w coarse sounds and ?faint crackles.   Medical Decision Making  Medically screening exam initiated at 9:45 PM.  Appropriate orders placed.  Carlos Hudson was informed that the remainder of the evaluation will be completed by another provider, this initial triage assessment does not replace that evaluation, and the importance of remaining in the ED until their evaluation is complete.  CXR, labs   Carlos Hudson, [place] 11/27/21 2148

## 2021-11-27 NOTE — ED Triage Notes (Signed)
Pt brought to ED by [facility] with c/o shortness of breath x2 days/. Has hx of COPD and has been without inhaler x2 days.   EMS Interventions Albuterol 5mg /neb Atrovent 0.5mg /neb Solumedrol 125mg  IVP 20g IV LUA   EMS Vitals BP 140/70 HR 81 RR 18 SPO2 100

## 2021-11-28 MED ORDER — PREDNISONE 20 MG PO TABS
60.0000 mg | ORAL_TABLET | Freq: Once | ORAL | Status: AC
  Administered 2021-11-28: 60 mg via ORAL
  Filled 2021-11-28: qty 3

## 2021-11-28 MED ORDER — PREDNISONE 50 MG PO TABS: ORAL_TABLET | ORAL | 0 refills | Status: DC

## 2021-11-28 NOTE — ED Notes (Addendum)
Nursing tech in lobby notified this RN that pt O2 saturations have decreased while pt is sleeping. Pt placed on 3L O2 via nasal cannula and assessed by this RN with no distress present. CN notified of the same.

## 2021-11-28 NOTE — ED Notes (Signed)
Patient refusing to keep oxygen in nose. Patient removed oxygen to go outside and smoke.

## 2021-11-28 NOTE — ED Provider Notes (Signed)
[facility] EMERGENCY DEPARTMENT Provider Note   CSN: [id number] Arrival date & time: 11/27/21  2138     History  No chief complaint on file.   Carlos Hudson is a 71 y.o. male.  The history is provided by the patient.  Shortness of Breath Severity:  Moderate Onset quality:  Gradual Duration:  2 days Timing:  Intermittent Progression:  Worsening Chronicity:  Recurrent Associated symptoms: cough and wheezing   Patient with history of COPD and tobacco use presents with shortness of breath.  Reports over the past 2 days had increasing cough, wheeze and shortness of breath.  He thinks it is due to pollen                    No fevers or vomiting.  He reports increased cough but no hemoptysis.  Reports some chest tightness with wheezing Patient reports he would like to continue smoking. He has home nebulizers.  He is not on home oxygen    Past Medical History:  Diagnosis Date   Asthma    COPD (chronic obstructive pulmonary disease) (HCC)    GERD (gastroesophageal reflux disease)      Home Medications Prior to Admission medications   Medication Sig Start Date End Date Taking? Authorizing Provider  predniSONE (DELTASONE) 50 MG tablet 1 tablet PO QD X4 days 11/28/21  Yes Zadie Rhine, MD  albuterol (VENTOLIN HFA) 108 (90 Base) MCG/ACT inhaler INHALE 2 PUFFS BY MOUTH EVERY 4 HOURS AS NEEDED FOR WHEEZING FOR SHORTNESS OF BREATH 10/28/21   Plotnikov, Georgina Quint, MD  loratadine (CLARITIN) 10 MG tablet Take 1 tablet (10 mg total) by mouth daily. 09/10/21   Plotnikov, Georgina Quint, MD  tadalafil (CIALIS) 20 MG tablet TAKE 1 TABLET BY MOUTH EVERY 3 DAYS FOR ERECTILE DYSFUNCTION 07/01/21   Plotnikov, Georgina Quint, MD      Allergies    Clindamycin/lincomycin, Fish allergy, Iodinated contrast media, Iodine, Aspirin, and Pepcid [famotidine]    Review of Systems   Review of Systems  Respiratory:  Positive for cough, shortness of breath and wheezing.   Cardiovascular:  Negative  for leg swelling.    Physical Exam Updated Vital Signs BP (!) 146/83   Pulse 78   Temp 97.6 F (36.4 C) (Oral)   Resp 19   SpO2 93%  Physical Exam CONSTITUTIONAL: Disheveled, no acute distress HEAD: Normocephalic/atraumatic EYES: EOMI ENMT: edentulous NECK: supple no meningeal signs SPINE/BACK:entire spine nontender CV: S1/S2 noted, no murmurs/rubs/gallops noted LUNGS:scattered wheezing bilaterally, no distress ABDOMEN: soft, nontender, obese NEURO: Pt is awake/alert/appropriate, moves all extremitiesx4.  No facial droop.   EXTREMITIES: pulses normal/equal, full ROM, no significant LE edema SKIN: warm, color normal PSYCH: no abnormalities of mood noted, alert and oriented to situation  ED Results / Procedures / Treatments   Labs (all labs ordered are listed, but only abnormal results are displayed) Labs Reviewed  COMPREHENSIVE METABOLIC PANEL - Abnormal; Notable for the following components:      Result Value   Glucose, Bld 110 (*)    Creatinine, Ser 1.25 (*)    All other components within normal limits  CBC    EKG EKG Interpretation  Date/Time:  Wednesday November 27 2021 21:41:31 EDT Ventricular Rate:  79 PR Interval:  156 QRS Duration: 134 QT Interval:  414 QTC Calculation: 474 R Axis:   91 Text Interpretation: Normal sinus rhythm Right bundle branch block Abnormal ECG No significant change since last tracing Confirmed by Zadie Rhine ([id number]) on 11/28/2021  5:37:44 AM  Radiology DG Chest 2 View  Result Date: 11/27/2021 CLINICAL DATA:  Shortness of breath. EXAM: CHEST - 2 VIEW COMPARISON:  Chest radiograph dated 05/10/2018. FINDINGS: There is diffuse chronic interstitial coarsening. Bibasilar atelectasis. No focal consolidation, pleural effusion, or pneumothorax. The cardiac silhouette is within normal limits. Atherosclerotic calcification of the aorta. Degenerative changes of the spine. No acute osseous pathology. IMPRESSION: No active cardiopulmonary disease.  Electronically Signed   By: Elgie Collard M.D.   On: 11/27/2021 22:21    Procedures Procedures    Medications Ordered in ED Medications  predniSONE (DELTASONE) tablet 60 mg (60 mg Oral Given 11/28/21 0552)    ED Course/ Medical Decision Making/ A&P Clinical Course as of 11/28/21 0633  Thu Nov 28, 2021  0556 Patient presents for a mild COPD exacerbation.  He reports he needs that "little pill" that dissolves in his mouth that is been given to him by his PCP.  I suspect he is asking for antibiotics, but there is no signs of any infectious etiology.  He already received nebs prior to arrival via EMS and appear to be improved.  He would benefit from a outpatient course of steroids.  Patient is not interested in tobacco cessation. [DW]  5027 Patient well-appearing, no acute distress.  No hypoxia on room air.  He already has nebs at home.  We will add on prednisone.  He will be discharged home No signs of CHF.  Low suspicion for PE at this time [DW]    Clinical Course User Index [DW] Zadie Rhine, MD                           Medical Decision Making Risk Prescription drug management.   This patient presents to the ED for concern of shortness of breath, this involves an extensive number of treatment options, and is a complaint that carries with it a high risk of complications and morbidity.  The differential diagnosis includes but is not limited to Acute coronary syndrome, pneumonia, acute pulmonary edema, pneumothorax, acute anemia, pulmonary embolism, COPD exacerbation    Comorbidities that complicate the patient evaluation: Patient's presentation is complicated by their history of COPD  Social Determinants of Health: Patient's  tobacco use   increases the complexity of managing their presentation  Additional history obtained: Records reviewed Primary Care Documents  Lab Tests: I Ordered, and personally interpreted labs.  The pertinent results include: Labs overall  unremarkable  Imaging Studies ordered: I ordered imaging studies including X-ray chest   I independently visualized and interpreted imaging which showed no acute findings I agree with the radiologist interpretation   Medicines ordered and prescription drug management: I ordered medication including prednisone for wheezing  Reevaluation: After the interventions noted above, I reevaluated the patient and found that they have :improved  Complexity of problems addressed: Patient's presentation is most consistent with  acute presentation with potential threat to life or bodily function  Disposition: After consideration of the diagnostic results and the patient's response to treatment,  I feel that the patent would benefit from discharge   .           Final Clinical Impression(s) / ED Diagnoses Final diagnoses:  COPD exacerbation (HCC)    Rx / DC Orders ED Discharge Orders          Ordered    predniSONE (DELTASONE) 50 MG tablet        11/28/21 7412  Zadie Rhine, MD 11/28/21 [phone]

## 2021-11-28 NOTE — ED Notes (Signed)
Upon rounding patient 86-87% RA sleeping. Triage and charge notified and patient placed on 3L o2. Improvement 93-96%. Triage RN came to update patient on rooming assignment.

## 2022-01-01 ENCOUNTER — Ambulatory Visit: Payer: Medicare Other | Admitting: Internal Medicine

## 2022-01-14 ENCOUNTER — Telehealth: Payer: Self-pay | Admitting: Internal Medicine

## 2022-01-14 MED ORDER — ALBUTEROL SULFATE HFA 108 (90 BASE) MCG/ACT IN AERS: INHALATION_SPRAY | RESPIRATORY_TRACT | 1 refills | Status: DC

## 2022-01-14 NOTE — Telephone Encounter (Signed)
Caller & Relationship to patient: Self  Call back number: [phone]  Date of last office visit: 6.20.23  Date of next office visit: 10.26.23  Medication(s) to be refilled:  albuterol (VENTOLIN HFA) 108 (90 Base) MCG/ACT inhaler     Preferred Pharmacy:  [facility]  Phone:  [phone]  Fax:  [phone]

## 2022-01-14 NOTE — Telephone Encounter (Signed)
Refill sent to [facility].../lmb 

## 2022-01-16 ENCOUNTER — Ambulatory Visit: Payer: Medicare Other | Admitting: Internal Medicine

## 2022-01-30 ENCOUNTER — Telehealth: Payer: Self-pay | Admitting: Internal Medicine

## 2022-01-30 MED ORDER — TADALAFIL 20 MG PO TABS: ORAL_TABLET | ORAL | 0 refills | Status: DC

## 2022-01-30 NOTE — Telephone Encounter (Signed)
Caller & Relationship to patient: PT  Call back number: [phone]  Date of last office visit: 09/10/2021  Date of next office visit: 02/06/2022  Medication(s) to be refilled:  tadalafil (CIALIS) 20 MG tablet     Preferred Pharmacy:   [id number] - [place] ([place]), [place] - [address]

## 2022-01-30 NOTE — Telephone Encounter (Signed)
[facility] rx [email or web address] KitchenRaechel Chute

## 2022-02-06 ENCOUNTER — Ambulatory Visit: Payer: Medicare Other | Admitting: Internal Medicine

## 2022-02-18 ENCOUNTER — Ambulatory Visit: Payer: Medicare Other | Admitting: Internal Medicine

## 2022-06-16 ENCOUNTER — Ambulatory Visit: Payer: Medicare Other | Admitting: Internal Medicine

## 2023-04-07 ENCOUNTER — Ambulatory Visit: Payer: Self-pay | Admitting: Internal Medicine

## 2023-04-07 NOTE — Telephone Encounter (Signed)
  Chief Complaint: dizziness Symptoms: moderate dizziness, nausea Frequency: x 3 weeks Pertinent Negatives: Patient denies vomiting, headache Disposition: [] ED /[] Urgent Care (no appt availability in office) / [x] Appointment(In office/virtual)/ []  [facility] [facility]/ [] Home Care/ [x] Refused Recommended Disposition /[] [place] [facility]/ []  Follow-up with PCP Additional Notes: Patient reports that for the last 3 weeks he has been experiencing episodes of moderate dizziness intermittently. Patient reports feeling like he is frozen and can't move during these episodes. Per protocol, this RN attempted to schedule in office visit tomorrow. Patient refused and states that he is unable to find transportation to be seen until next week. This RN advised patient that we would speak with proper personnel for follow-up regarding delaying appt as protocol suggests being seen within 24 hours. Patient advised to call back with worsening symptoms. Patient verbalized understanding.    Copied from CRM [id number]. Topic: Clinical - Red Word Triage >> Apr 07, 2023  4:04 PM Leila BROCKS wrote: Red Word that prompted transfer to Nurse Triage: Patient [phone] c/o dizzy when standing or walking, fainted like and cannot see well like vision is blurry like vertigo. Patient denies any pain. Reason for Disposition  [1] MODERATE dizziness (e.g., vertigo; feels very unsteady, interferes with normal activities) AND [2] has NOT been evaluated by doctor (or NP/PA) for this  Answer Assessment - Initial Assessment Questions 1. DESCRIPTION: Describe your dizziness.     I get dizzy and feel frozen like I can't move 2. VERTIGO: Do you feel like either you or the room is spinning or tilting?      Room is moving 3. LIGHTHEADED: Do you feel lightheaded? (e.g., somewhat faint, woozy, weak upon standing)     everything I look at looks sideways 4. SEVERITY: How bad is it?  Can you walk?   - MILD: Feels slightly  dizzy and unsteady, but is walking normally.   - MODERATE: Feels unsteady when walking, but not falling; interferes with normal activities (e.g., school, work).   - SEVERE: Unable to walk without falling, or requires assistance to walk without falling.     moderate 5. ONSET:  When did the dizziness begin?     3 weeks, comes and goes 6. AGGRAVATING FACTORS: Does anything make it worse? (e.g., standing, change in head position)     Reading glasses make it worse 7. CAUSE: What do you think is causing the dizziness?     Not sure 8. RECURRENT SYMPTOM: Have you had dizziness before? If Yes, ask: When was the last time? What happened that time?     no 9. OTHER SYMPTOMS: Do you have any other symptoms? (e.g., headache, weakness, numbness, vomiting, earache)     nausea  Protocols used: Dizziness - Vertigo-A-AH

## 2023-04-09 NOTE — Telephone Encounter (Signed)
Take over-the-counter Antivert.  Go to urgent care.  Do not drive if dizzy.  Thanks

## 2023-04-10 NOTE — Telephone Encounter (Signed)
I've attempted to reach out to the pt and inform him of Dr. Loren Racer advise "Take over-the-counter Antivert.  Go to urgent care.  Do not drive if dizzy.  Thanks"

## 2023-04-22 ENCOUNTER — Ambulatory Visit: Payer: Self-pay | Admitting: Internal Medicine

## 2023-04-22 NOTE — Telephone Encounter (Signed)
Copied from CRM [id number]. Topic: Clinical - Red Word Triage >> Apr 22, 2023  3:17 PM Corin V wrote: [facility] that prompted transfer to Nurse Triage: Patient is having pain on left side up neck and through chest. He is having some pain in his stomach. He cannot lay on his left side/shoulder to sleep. His dizziness is also increasing in severity.   Chief Complaint: Chest pain, dizziness Symptoms: chest pain, dizziness,  Frequency: off and on for a year for the chest pain and the dizziness for two weeks Pertinent Negatives: Patient denies nausea, vomiting, diarrhea,  Disposition: [] ED /[] Urgent Care (no appt availability in office) / [] Appointment(In office/virtual)/ []  [facility] [facility]/ [] Home Care/ [] Refused Recommended Disposition /[] [place] [facility]/ []  Follow-up with PCP Additional Notes: Patient called and advised that he has pain on his left side around his left side around left shoulder and around the left side and patient states that it sounds like fluid in his chest. Patient states that he has had pain on the left side of his chest for about a year on and off, dizziness for two weeks, and states he   Answer Assessment - Initial Assessment Questions 1. LOCATION: "Where does it hurt?"       Left side of chest and into left shoulder off and on for a year 2. RADIATION: "Does the pain go anywhere else?" (e.g., into neck, jaw, arms, back)     Into lower back (pt states this is from bending his back) 3. ONSET: "When did the chest pain begin?" (Minutes, hours or days)      A year ago 4. PATTERN: "Does the pain come and go, or has it been constant since it started?"  "Does it get worse with exertion?"      Off and on --nothing makes it worse or better 5. DURATION: "How long does it last" (e.g., seconds, minutes, hours)     varies 6. SEVERITY: "How bad is the pain?"  (e.g., Scale 1-10; mild, moderate, or severe)    - MILD (1-3): doesn't interfere with normal activities     -  MODERATE (4-7): interferes with normal activities or awakens from sleep    - SEVERE (8-10): excruciating pain, unable to do any normal activities       Decently bad 7. CARDIAC RISK FACTORS: "Do you have any history of heart problems or risk factors for heart disease?" (e.g., angina, prior heart attack; diabetes, high blood pressure, high cholesterol, smoker, or strong family history of heart disease)     No 8. PULMONARY RISK FACTORS: "Do you have any history of lung disease?"  (e.g., blood clots in lung, asthma, emphysema, birth control pills)     Patient states he is not sure about COPD 9. CAUSE: "What do you think is causing the chest pain?"     unknown 10. OTHER SYMPTOMS: "Do you have any other symptoms?" (e.g., dizziness, nausea, vomiting, sweating, fever, difficulty breathing, cough)       Dizziness x 2 weeks, cough for two years (pt states from smoking)  Protocols used: Chest Pain-A-AH

## 2023-04-22 NOTE — Telephone Encounter (Signed)
  Chief Complaint: chest pain, left shoulder pain, SOB and dizziness Symptoms: chest pain, SOB, left shoulder pain, dizziness, "feels like something is sitting on my chest" Frequency: constant and worsening x 2 weeks  Disposition: [x] ED /[] Urgent Care (no appt availability in office) / [] Appointment(In office/virtual)/ []  [place] [facility]/ [] Home Care/ [] Refused Recommended Disposition /[] [place] [facility]/ []  Follow-up with PCP Additional Notes: Patient calling back, disconnected from [facility] during triage. Patient states he is concerned about his heart due to the chest pain and his dizziness. Patient states chest pain is a 5/10. Advised ED, patient states he trusts Dr Posey Rea and would like to hear it from him as well and he is unsure if he would go to the ED today. Called [facility] in informed them of patient's ED refusal. Reason for Disposition  Pain also in shoulder(s) or arm(s) or jaw  (Exception: Pain is clearly made worse by movement.)  Protocols used: Chest Pain-A-AH

## 2023-04-24 NOTE — Telephone Encounter (Signed)
I agree with the the recommendation to go to ER. The patient had 4 no-show appointments with me lately.  Thank you

## 2023-04-24 NOTE — Telephone Encounter (Signed)
Pt did not answer call... Provider has stated the following if pt calls back plz inform him of the following "I agree with the the recommendation to go to ER. The patient had 4 no-show appointments with me lately.  Thank you"

## 2023-04-27 ENCOUNTER — Ambulatory Visit: Payer: Medicare Other | Admitting: Family Medicine

## 2023-04-27 ENCOUNTER — Ambulatory Visit: Payer: Medicare Other | Admitting: Internal Medicine

## 2023-04-27 NOTE — Progress Notes (Deleted)
   Acute Office Visit  Subjective:     Patient ID: Carlos Hudson, male    DOB: 1950-08-02, 73 y.o.   MRN: [id number]  No chief complaint on file.   HPI Patient is in today for ***  ROS Per HPI      Objective:    There were no vitals taken for this visit.   Physical Exam Vitals and nursing note reviewed.  Constitutional:      Appearance: Normal appearance.  HENT:     Head: Normocephalic and atraumatic.  Eyes:     Extraocular Movements: Extraocular movements intact.  Cardiovascular:     Rate and Rhythm: Normal rate and regular rhythm.     Pulses: Normal pulses.     Heart sounds: Normal heart sounds.  Pulmonary:     Effort: Pulmonary effort is normal.     Breath sounds: Normal breath sounds.  Musculoskeletal:        General: Normal range of motion.     Cervical back: Normal range of motion.  Skin:    General: Skin is warm and dry.  Neurological:     General: No focal deficit present.     Mental Status: He is alert and oriented to person, place, and time.  Psychiatric:        Mood and Affect: Mood normal.        Behavior: Behavior normal.   No results found for any visits on 04/27/23.      Assessment & Plan:  ***  No orders of the defined types were placed in this encounter.   No follow-ups on file.  Moshe Cipro, FNP

## 2023-05-07 ENCOUNTER — Ambulatory Visit: Payer: Self-pay | Admitting: Internal Medicine

## 2023-05-07 NOTE — Telephone Encounter (Signed)
1st attempt, This RN attempted to reach pt after call was disconnected during transfer. LMOM advising pt to return call to office. Routing for call back.  Copied from CRM [id number]. Topic: Clinical - Red Word Triage >> May 07, 2023 10:51 AM Denese Killings wrote: Red Word that prompted transfer to Nurse Triage: Patient has been having dizzy spells and have to hold on to something in order not to fall.

## 2023-05-07 NOTE — Telephone Encounter (Addendum)
  Chief Complaint: chest pressure and  Pertinent Negatives: Patient denies currently having s/s.  Notes: pt c/o dizziness and chest pressure last week. Pt states that when he gets his dizzy spell he needs to grab onto something. Pt states that he frequently gets this when he bends over. Pt denies having s/s currently. At this time call was disconnected, 3 attempts to call back were made. VM left requesting pt call clinic back. D/t disconnected calls pt was not fully triage by writer, nor was pt was given a disposition or care instructions.

## 2023-05-14 ENCOUNTER — Ambulatory Visit: Payer: Medicare Other | Admitting: Internal Medicine

## 2023-05-22 ENCOUNTER — Ambulatory Visit: Payer: Medicare Other | Admitting: Internal Medicine

## 2023-06-02 ENCOUNTER — Ambulatory Visit: Payer: Medicare Other | Admitting: Internal Medicine

## 2023-08-04 ENCOUNTER — Ambulatory Visit: Admitting: Internal Medicine

## 2023-08-19 ENCOUNTER — Ambulatory Visit: Admitting: Internal Medicine

## 2023-09-28 ENCOUNTER — Ambulatory Visit: Payer: Self-pay

## 2023-09-28 NOTE — Telephone Encounter (Signed)
 Copied from CRM [id number]. Topic: Clinical - Red Word Triage >> Sep 28, 2023  4:26 PM Nurse Adisen Bennion A wrote: Reason for CRM: Complaints of chest pain, feels like something is sitting on my chest, dizzy when coughing >> Sep 28, 2023  4:29 PM Nurse Lotoya Casella A wrote: Patient outreach attempted. He called in earlier and was scheduled an acute appt for 7/15. Patient should have been triaged for complaints of chest pain, dizzy when coughs, feels like there is something sitting on my chest..

## 2023-10-06 ENCOUNTER — Ambulatory Visit: Admitting: Internal Medicine

## 2023-11-08 ENCOUNTER — Emergency Department (HOSPITAL_COMMUNITY)
Admission: EM | Admit: 2023-11-08 | Discharge: 2023-11-08 | Disposition: A | Discharge status: Home / Self Care | Attending: Emergency Medicine | Admitting: Emergency Medicine

## 2023-11-08 ENCOUNTER — Emergency Department (HOSPITAL_COMMUNITY)

## 2023-11-08 ENCOUNTER — Other Ambulatory Visit: Payer: Self-pay

## 2023-11-08 DIAGNOSIS — M79605 Pain in left leg: Secondary | ICD-10-CM | POA: Insufficient documentation

## 2023-11-08 DIAGNOSIS — M25552 Pain in left hip: Secondary | ICD-10-CM | POA: Diagnosis not present

## 2023-11-08 DIAGNOSIS — M160 Bilateral primary osteoarthritis of hip: Secondary | ICD-10-CM | POA: Diagnosis not present

## 2023-11-08 DIAGNOSIS — M79673 Pain in unspecified foot: Secondary | ICD-10-CM | POA: Diagnosis not present

## 2023-11-08 DIAGNOSIS — M545 Low back pain, unspecified: Secondary | ICD-10-CM | POA: Insufficient documentation

## 2023-11-08 DIAGNOSIS — G20A1 Parkinson's disease without dyskinesia, without mention of fluctuations: Secondary | ICD-10-CM | POA: Insufficient documentation

## 2023-11-08 DIAGNOSIS — M47816 Spondylosis without myelopathy or radiculopathy, lumbar region: Secondary | ICD-10-CM | POA: Diagnosis not present

## 2023-11-08 MED ORDER — DEXAMETHASONE SODIUM PHOSPHATE 10 MG/ML IJ SOLN
10.0000 mg | Freq: Once | INTRAMUSCULAR | Status: AC
Start: 2023-11-08 — End: 2023-11-08
  Administered 2023-11-08: 10 mg via INTRAVENOUS
  Filled 2023-11-08: qty 1

## 2023-11-08 MED ORDER — DICLOFENAC EPOLAMINE 1.3 % EX PTCH
1.0000 | MEDICATED_PATCH | Freq: Two times a day (BID) | CUTANEOUS | Status: DC
  Administered 2023-11-08: 1 via TRANSDERMAL
  Filled 2023-11-08 (×2): qty 1

## 2023-11-08 MED ORDER — DICLOFENAC EPOLAMINE 1.3 % EX PTCH: 1.0000 | MEDICATED_PATCH | Freq: Two times a day (BID) | CUTANEOUS | 1 refills | Status: AC

## 2023-11-08 MED ORDER — PREDNISONE 20 MG PO TABS: 40.0000 mg | ORAL_TABLET | Freq: Every day | ORAL | 0 refills | Status: AC

## 2023-11-08 NOTE — ED Provider Notes (Signed)
 [place] EMERGENCY DEPARTMENT AT [facility] Provider Note   CSN: [id number] Arrival date & time: 11/08/23  1330     Patient presents with: No chief complaint on file.   Carlos Hudson is a 73 y.o. male.   .   Patient presents with left leg pain. Patient has a history of Parkinson's, no fall, injury, obvious precipitant.  Over the past 8 days he has had pain in the left posterior leg rating down the left posterior and lateral aspect worse with activity, markedly better at rest, minimal improvement with OTC medication. No incontinence, abdominal pain, fever, or other complaints.    Prior to Admission medications   Medication Sig Start Date End Date Taking? Authorizing Provider  diclofenac  (FLECTOR ) 1.3 % PT[facility] Place 1 patch onto the skin 2 (two) times daily. 11/08/23  Yes Garrick Charleston, MD  predniSONE  (DELTASONE ) 20 MG tablet Take 2 tablets (40 mg total) by mouth daily with breakfast. For the next four days 11/08/23  Yes Garrick Charleston, MD    Allergies: Clindamycin /lincomycin, Fish allergy, Iodinated contrast media, Iodine, Aspirin, and Pepcid  [famotidine ]    Review of Systems  Updated Vital Signs BP 110/70   Pulse 76   Temp 97.9 F (36.6 C) (Oral)   Resp 18   SpO2 96%   Physical Exam Vitals and nursing note reviewed.  Constitutional:      General: He is not in acute distress.    Appearance: He is well-developed.  HENT:     Head: Normocephalic and atraumatic.  Eyes:     Conjunctiva/sclera: Conjunctivae normal.  Cardiovascular:     Rate and Rhythm: Normal rate and regular rhythm.     Pulses: Normal pulses.  Pulmonary:     Effort: Pulmonary effort is normal. No respiratory distress.     Breath sounds: No stridor.  Abdominal:     General: There is no distension.  Musculoskeletal:     Comments: No deformities, patient flexes each hip independently to command, has pain referred in the left lower back with leg elevation.  Skin:    General: Skin is  warm and dry.  Neurological:     General: No focal deficit present.     Mental Status: He is alert and oriented to person, place, and time.     (all labs ordered are listed, but only abnormal results are displayed) Labs Reviewed - No data to display  EKG: None  Radiology: DG Hip Unilat W or Wo Pelvis 2-3 Views Left Result Date: 11/08/2023 CLINICAL DATA:  New left hip pain EXAM: DG HIP (WITH OR WITHOUT PELVIS) 2-3V LEFT COMPARISON:  None Available. FINDINGS: Frontal view of the pelvis as well as frontal and frogleg lateral views of the left hip are obtained. No fracture, subluxation, or dislocation. Symmetrical bilateral hip osteoarthritis. The sacroiliac joints are unremarkable. Moderate facet hypertrophy at the lumbosacral junction. IMPRESSION: 1. 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 2. No acute displaced fracture. Electronically Signed   By: Ozell Daring M.D.   On: 11/08/2023 14:49     Procedures   Medications Ordered in the ED  diclofenac  (FLECTOR ) 1.3 % 1 patch (1 patch Transdermal Patch Applied 11/08/23 1409)  dexamethasone  (DECADRON ) injection 10 mg (10 mg Intravenous Given 11/08/23 1403)                                    Medical Decision Making Patient with  left leg pain, no focal neurodeficits, abdominal pain, incontinence or retention all reassuring for low suspicion of cord issues, some suspicion for lumbosacral radiculopathy versus musculoskeletal injury versus hip fracture, though the patient's absence of trauma is reassuring. X-ray ordered, meds ordered  Amount and/or Complexity of Data Reviewed Independent Historian: EMS Radiology: ordered and independent interpretation performed. Decision-making details documented in ED Course.  Risk Prescription drug management.   3:22 PM Patient in no distress, awake, alert, ambulatory about the room without much limitation. We discussed all findings, x-ray, meds, importance of following up as an  outpatient, and medications will be provided, patient appropriate for discharge with close outpatient follow-up.     Final diagnoses:  Left leg pain    ED Discharge Orders          Ordered    predniSONE  (DELTASONE ) 20 MG tablet  Daily with breakfast        11/08/23 1521    diclofenac  (FLECTOR ) 1.3 % PTCH  2 times daily        11/08/23 1521               Garrick Charleston, MD 11/08/23 [phone]

## 2023-11-08 NOTE — Discharge Instructions (Signed)
 Today's evaluation has been generally reassuring.  Your pain is likely due to lumbosacral radiculopathy or sciatica.  However, it is important to follow-up with your primary care physician for appropriate ongoing management of your condition.  Take medication as prescribed.  If the diclofenac  patches are prohibitively expensive, consider using over-the-counter equivalents.  You can discuss this with the pharmacist onsite.

## 2023-11-08 NOTE — ED Triage Notes (Signed)
 Pt bib ems from home; c/o L hip pain, radiating to L foot x 8 days; hx parkinson's;no meds taken, no new injury; no recent falls; a and o x 4, ambulatory with ems; 140/86, HR 70, 96% RA, cbg 160

## 2023-11-16 ENCOUNTER — Telehealth: Payer: Self-pay

## 2023-11-16 NOTE — Telephone Encounter (Signed)
 Called patient back, noticed he went to the ED on 8/17. CRM was unclear of location for left side pain.  Tried to call patient back twice and got message phone number disconnected.

## 2023-11-16 NOTE — Telephone Encounter (Signed)
 Copied from CRM #[id number]. Topic: Appointments - Scheduling Inquiry for Clinic >> Nov 16, 2023  9:44 AM Carlos Hudson wrote: Reason for CRM:   Patient states he has a terrible pain that goes all the way down his left side, started about a week ago. States it gets really bad if he's up walking and moving around but calms down when he's at rest. Declined NT - wants an appt around 9-10a. Please advise, could not schedule due to red word.

## 2023-11-27 ENCOUNTER — Ambulatory Visit: Payer: Self-pay

## 2023-11-27 NOTE — Telephone Encounter (Signed)
 FYI Only or Action Required?: FYI only for provider.  Patient was last seen in primary care on 09/10/2021 by Plotnikov, Karlynn GAILS, MD.  Called Nurse Triage reporting Hip Pain.  Symptoms began several weeks ago.  Interventions attempted: OTC medications: aspirin.  Symptoms are: unchanged.  Triage Disposition: See PCP When Office is Open (Within 3 Days) Later appointment made per patient requests to see Dr. Garald  Patient/caregiver understands and will follow disposition?: Yes  Copied from CRM [id number]. Topic: Clinical - Red Word Triage >> Nov 27, 2023 12:25 PM Dedra B wrote: Red Word that prompted transfer to Nurse Triage: Pt having bad pain from his L hip down to to his foot. Warm transfer to nurse triage. Reason for Disposition  [1] MODERATE pain (e.g., interferes with normal activities, limping) AND [2] present > 3 days  Answer Assessment - Initial Assessment Questions 1. LOCATION and RADIATION: Where is the pain located? Does the pain spread (shoot) anywhere else?     Left hip pain 2. QUALITY: What does the pain feel like?  (e.g., sharp, dull, aching, burning)     Throbbing at hip and foot is burning 3. SEVERITY: How bad is the pain? What does it keep you from doing?   (Scale 1-10; or mild, moderate, severe)     9/10 4. ONSET: When did the pain start? Does it come and go, or is it there all the time?     Three weeks ago 5. WORK OR EXERCISE: Has there been any recent work or exercise that involved this part of the body?      Used a heavy leaf blower and felt a pinch that went away, then came back 6. CAUSE: What do you think is causing the hip pain?      unsure 7. AGGRAVATING FACTORS: What makes the hip pain worse? (e.g., walking, climbing stairs, running)      8. OTHER SYMPTOMS: Do you have any other symptoms? (e.g., back pain, pain shooting down leg,  fever, rash)     Pain radiates down his leg   Patient using Bayer Aspirin for pain  symptoms  Protocols used: Hip Pain-A-AH

## 2023-12-03 ENCOUNTER — Ambulatory Visit: Admitting: Internal Medicine
